# Patient Record
Sex: Male | Born: 1995 | Race: White | Hispanic: No | Marital: Single | Smoking: Never smoker
Health system: Southern US, Community
[De-identification: ages and names within clinical notes are randomized; demographics above are authoritative.]

## PROBLEM LIST (undated history)

## (undated) DIAGNOSIS — K219 Gastro-esophageal reflux disease without esophagitis: Secondary | ICD-10-CM

## (undated) DIAGNOSIS — S62309A Unspecified fracture of unspecified metacarpal bone, initial encounter for closed fracture: Secondary | ICD-10-CM

## (undated) HISTORY — PX: TYMPANOSTOMY TUBE PLACEMENT: SHX32

---
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
## 2018-03-29 DIAGNOSIS — S62309A Unspecified fracture of unspecified metacarpal bone, initial encounter for closed fracture: Secondary | ICD-10-CM

## 2018-03-29 HISTORY — DX: Unspecified fracture of unspecified metacarpal bone, initial encounter for closed fracture: S62.309A

## 2018-03-30 ENCOUNTER — Emergency Department (HOSPITAL_BASED_OUTPATIENT_CLINIC_OR_DEPARTMENT_OTHER)
Admission: EM | Admit: 2018-03-30 | Discharge: 2018-03-30 | Disposition: A | Discharge status: Home / Self Care | Payer: 59 | Attending: Emergency Medicine | Admitting: Emergency Medicine

## 2018-03-30 ENCOUNTER — Encounter (HOSPITAL_BASED_OUTPATIENT_CLINIC_OR_DEPARTMENT_OTHER): Payer: Self-pay | Admitting: Emergency Medicine

## 2018-03-30 ENCOUNTER — Other Ambulatory Visit: Payer: Self-pay

## 2018-03-30 ENCOUNTER — Emergency Department (HOSPITAL_BASED_OUTPATIENT_CLINIC_OR_DEPARTMENT_OTHER): Payer: 59

## 2018-03-30 DIAGNOSIS — Y929 Unspecified place or not applicable: Secondary | ICD-10-CM | POA: Insufficient documentation

## 2018-03-30 DIAGNOSIS — S62316A Displaced fracture of base of fifth metacarpal bone, right hand, initial encounter for closed fracture: Secondary | ICD-10-CM | POA: Insufficient documentation

## 2018-03-30 DIAGNOSIS — Y939 Activity, unspecified: Secondary | ICD-10-CM | POA: Insufficient documentation

## 2018-03-30 DIAGNOSIS — Y999 Unspecified external cause status: Secondary | ICD-10-CM | POA: Insufficient documentation

## 2018-03-30 DIAGNOSIS — W231XXA Caught, crushed, jammed, or pinched between stationary objects, initial encounter: Secondary | ICD-10-CM | POA: Insufficient documentation

## 2018-03-30 DIAGNOSIS — Z79899 Other long term (current) drug therapy: Secondary | ICD-10-CM | POA: Insufficient documentation

## 2018-03-30 MED ORDER — NAPROXEN 500 MG PO TABS: 500.0000 mg | ORAL_TABLET | Freq: Two times a day (BID) | ORAL | 0 refills | Status: DC

## 2018-03-30 NOTE — ED Provider Notes (Signed)
[facility] EMERGENCY DEPARTMENT Provider Note   CSN: [id number] Arrival date & time: 03/30/18  1354     History   Chief Complaint Chief Complaint  Patient presents with  . Wrist Injury    HPI Kerry Padilla is a 22 y.o. male.  Patient presents the emergency department with acute onset of right hand pain yesterday when a sliding glass door was accidentally closed on the hand.  He has had pain more so over the ulnar aspect of the hand.  No treatments prior to arrival.  Pain is worse with palpation and movement.  He denies numbness or tingling. The course is constant.     Past Medical History:  Diagnosis Date  . Ankle sprain   . Concussion 2011  . Nasal bone fx-closed   . Scoliosis   . Shoulder sprain     There are no active problems to display for this patient.   Past Surgical History:  Procedure Laterality Date  . CIRCUMCISION  1999  . TYMPANOSTOMY TUBE PLACEMENT     15 months old        Home Medications    Prior to Admission medications   Medication Sig Start Date End Date Taking? Authorizing Provider  acetaminophen (TYLENOL) 325 MG tablet Take 3 tablets (975 mg total) by mouth every 6 (six) hours as needed for fever. Patient not taking: Reported on 03/07/2015 05/07/14   Marcellina MillinGaley, Timothy, MD  acetaminophen (TYLENOL) 500 MG tablet Take 1,000 mg by mouth.    [provider]  ibuprofen (ADVIL,MOTRIN) 600 MG tablet Take 1 tablet (600 mg total) by mouth every 6 (six) hours as needed for fever. Patient not taking: Reported on 03/07/2015 05/07/14   Marcellina MillinGaley, Timothy, MD    Family History Family History  Problem Relation Age of Onset  . Heart Problems Maternal Grandfather        MI died at 1455  . Other Maternal Grandmother        Died at 5167 due to respiratory failure    Social History Social History   Tobacco Use  . Smoking status: Never Smoker  . Smokeless tobacco: Never Used  Substance Use Topics  . Alcohol use: No    Alcohol/week: 0.0 oz    . Drug use: No     Allergies   Strawberry extract   Review of Systems Review of Systems  Constitutional: Negative for activity change.  Musculoskeletal: Positive for arthralgias and myalgias. Negative for back pain, gait problem, joint swelling and neck pain.  Skin: Negative for wound.  Neurological: Negative for weakness and numbness.     Physical Exam Updated Vital Signs BP (!) 145/87 (BP Location: Left Arm)   Pulse 88   Temp 98.5 F (36.9 C) (Oral)   Resp 16   Ht 5\' 11"  (1.803 m)   Wt 77.1 kg (170 lb)   SpO2 100%   BMI 23.71 kg/m   Physical Exam  Constitutional: He appears well-developed and well-nourished.  HENT:  Head: Normocephalic and atraumatic.  Mouth/Throat: Oropharynx is clear and moist.  Eyes: Conjunctivae are normal.  Neck: Normal range of motion. Neck supple.  Cardiovascular: Normal pulses. Exam reveals no decreased pulses.  Musculoskeletal: He exhibits tenderness. He exhibits no edema.       Right elbow: Normal.      Right wrist: He exhibits tenderness. He exhibits normal range of motion and no bony tenderness.       Right forearm: Normal.       Right  hand: He exhibits decreased range of motion, tenderness and bony tenderness.       Hands: Neurological: He is alert. No sensory deficit.  Motor, sensation, and vascular distal to the injury is fully intact.   Skin: Skin is warm and dry.  Psychiatric: He has a normal mood and affect.  Nursing note and vitals reviewed.    ED Treatments / Results  Labs (all labs ordered are listed, but only abnormal results are displayed) Labs Reviewed - No data to display  EKG None  Radiology Dg Wrist Complete Right  Result Date: 03/30/2018 CLINICAL DATA:  Trauma to the right hand last night. EXAM: RIGHT WRIST - COMPLETE 3+ VIEW COMPARISON:  None. FINDINGS: There is comminuted displaced fracture at the base of the fifth metacarpal. There is no dislocation. IMPRESSION: Fracture at the base of fifth metacarpal.  Electronically Signed   By: Sherian Rein M.D.   On: 03/30/2018 14:37   Dg Hand Complete Right  Result Date: 03/30/2018 CLINICAL DATA:  Trauma to the right hand last night. EXAM: RIGHT HAND - COMPLETE 3+ VIEW COMPARISON:  None. FINDINGS: There is intra articular displaced comminuted fracture at the base of the fifth metacarpal. There is no dislocation. IMPRESSION: Fracture at the base of the fifth metacarpal. Electronically Signed   By: Sherian Rein M.D.   On: 03/30/2018 14:36    Procedures Procedures (including critical care time)  Medications Ordered in ED Medications - No data to display   Initial Impression / Assessment and Plan / ED Course  I have reviewed the triage vital signs and the nursing notes.  Pertinent labs & imaging results that were available during my care of the patient were reviewed by me and considered in my medical decision making (see chart for details).     Patient seen and examined.   Vital signs reviewed and are as follows: BP (!) 145/87 (BP Location: Left Arm)   Pulse 88   Temp 98.5 F (36.9 C) (Oral)   Resp 16   Ht 5\' 11"  (1.803 m)   Wt 77.1 kg (170 lb)   SpO2 100%   BMI 23.71 kg/m   Discussed images with patient at bedside.  Will place an ulnar gutter splint and have patient follow-up with orthopedic hand surgery.  Naproxen for pain.  Discussed rice protocol.  Final Clinical Impressions(s) / ED Diagnoses   Final diagnoses:  Closed displaced fracture of base of fifth metacarpal bone of right hand, initial encounter   Patient with base of fifth metatarsal fracture, closed after injury yesterday.  Patient placed in a splint.  Hand is neurovascularly intact.  Orthopedic hand referral given.  ED Discharge Orders        Ordered    naproxen (NAPROSYN) 500 MG tablet  2 times daily     03/30/18 1519       Renne Crigler, PA-C 03/30/18 1523    Linwood Dibbles, MD 03/31/18 [phone]

## 2018-03-30 NOTE — ED Triage Notes (Signed)
Patient states that he had his right hand caught in a door last night. Patient to his wrist and hand on the right side

## 2018-03-30 NOTE — Discharge Instructions (Signed)
Please read and follow all provided instructions.  Your diagnoses today include:  1. Closed displaced fracture of base of fifth metacarpal bone of right hand, initial encounter     Tests performed today include:  An x-ray of the affected area - shows fractured base of the 5th metacarpal  Vital signs. See below for your results today.   Medications prescribed:   Naproxen - anti-inflammatory pain medication  Do not exceed 500mg  naproxen every 12 hours, take with food  You have been prescribed an anti-inflammatory medication or NSAID. Take with food. Take smallest effective dose for the shortest duration needed for your pain. Stop taking if you experience stomach pain or vomiting.   Take any prescribed medications only as directed.  Home care instructions:   Follow any educational materials contained in this packet  Follow R.I.C.E. Protocol:  R - rest your injury   I  - use ice on injury without applying directly to skin  C - compress injury with bandage or splint  E - elevate the injury as much as possible  Follow-up instructions: Please follow-up with the provided orthopedic physician in 1 week.   Return instructions:   Please return if your fingers are numb or tingling, appear gray or blue, or you have severe pain (also elevate the arm and loosen splint or wrap if you were given one)  Please return to the Emergency Department if you experience worsening symptoms.   Please return if you have any other emergent concerns.  Additional Information:  Your vital signs today were: BP (!) 145/87 (BP Location: Left Arm)    Pulse 88    Temp 98.5 F (36.9 C) (Oral)    Resp 16    Ht 5\' 11"  (1.803 m)    Wt 77.1 kg (170 lb)    SpO2 100%    BMI 23.71 kg/m  If your blood pressure (BP) was elevated above 135/85 this visit, please have this repeated by your doctor within one month. --------------

## 2018-03-31 ENCOUNTER — Other Ambulatory Visit: Payer: Self-pay | Admitting: Sports Medicine

## 2018-03-31 DIAGNOSIS — M79644 Pain in right finger(s): Secondary | ICD-10-CM

## 2018-04-03 ENCOUNTER — Ambulatory Visit
Admission: RE | Admit: 2018-04-03 | Discharge: 2018-04-03 | Disposition: A | Discharge status: Home / Self Care | Payer: 59 | Source: Ambulatory Visit | Attending: Sports Medicine | Admitting: Sports Medicine

## 2018-04-03 DIAGNOSIS — M79644 Pain in right finger(s): Secondary | ICD-10-CM

## 2018-04-04 ENCOUNTER — Other Ambulatory Visit: Payer: Self-pay

## 2018-04-04 ENCOUNTER — Encounter (HOSPITAL_BASED_OUTPATIENT_CLINIC_OR_DEPARTMENT_OTHER): Payer: Self-pay | Admitting: *Deleted

## 2018-04-07 NOTE — H&P (Signed)
[facility] [address]   [address] [place], [place] [id number] [phone]  A Division of [facility]  RE: Kerry Padilla, Kerry Padilla                                  [id number]         DOB: 07/11/1996 04/04/2018  Reason for visit:  Referral from Dr. Farris HasKramer for right 5th metacarpal base intraarticular fracture subluxation.    HPI: He did this on Saturday.  His hand was slammed in a barn door.  Pain has been well controlled.    OBJECTIVE: The patient is a well appearing male, in no apparent distress.  Skin is benign.  Neurovascularly intact.    IMAGES: X-rays including a CAT scan show an intraarticular split and displacement with subluxation of the base of the 5th metacarpal.    ASSESSMENT/PLAN:  Given his articular step off and displacement, I would recommend an open versus closed reduction and pinning versus fixation.  We will set this up for next week.      Jewel Baizeimothy D.  Eulah PontMurphy, M.D.  Electronically verified by Jewel Baizeimothy D. Eulah PontMurphy, M.D. TDM:pmw D 04/04/18 T 04/07/18

## 2018-04-08 ENCOUNTER — Ambulatory Visit (HOSPITAL_BASED_OUTPATIENT_CLINIC_OR_DEPARTMENT_OTHER): Payer: Self-pay | Admitting: Anesthesiology

## 2018-04-08 ENCOUNTER — Other Ambulatory Visit: Payer: Self-pay

## 2018-04-08 ENCOUNTER — Ambulatory Visit (HOSPITAL_BASED_OUTPATIENT_CLINIC_OR_DEPARTMENT_OTHER)
Admission: RE | Admit: 2018-04-08 | Discharge: 2018-04-08 | Disposition: A | Discharge status: Home / Self Care | Payer: Self-pay | Source: Ambulatory Visit | Attending: Orthopedic Surgery | Admitting: Orthopedic Surgery

## 2018-04-08 ENCOUNTER — Encounter (HOSPITAL_BASED_OUTPATIENT_CLINIC_OR_DEPARTMENT_OTHER): Payer: Self-pay

## 2018-04-08 ENCOUNTER — Encounter (HOSPITAL_BASED_OUTPATIENT_CLINIC_OR_DEPARTMENT_OTHER)
Admission: RE | Disposition: A | Discharge status: Home / Self Care | Payer: Self-pay | Source: Ambulatory Visit | Attending: Orthopedic Surgery

## 2018-04-08 DIAGNOSIS — W230XXA Caught, crushed, jammed, or pinched between moving objects, initial encounter: Secondary | ICD-10-CM | POA: Insufficient documentation

## 2018-04-08 DIAGNOSIS — K219 Gastro-esophageal reflux disease without esophagitis: Secondary | ICD-10-CM | POA: Insufficient documentation

## 2018-04-08 DIAGNOSIS — S62316A Displaced fracture of base of fifth metacarpal bone, right hand, initial encounter for closed fracture: Secondary | ICD-10-CM | POA: Insufficient documentation

## 2018-04-08 HISTORY — DX: Gastro-esophageal reflux disease without esophagitis: K21.9

## 2018-04-08 HISTORY — PX: CLOSED REDUCTION METACARPAL WITH PERCUTANEOUS PINNING: SHX5613

## 2018-04-08 HISTORY — DX: Unspecified fracture of unspecified metacarpal bone, initial encounter for closed fracture: S62.309A

## 2018-04-08 SURGERY — CLOSED REDUCTION, FRACTURE, METACARPAL BONE, WITH PERCUTANEOUS PINNING
Anesthesia: General | Site: Hand | Laterality: Right

## 2018-04-08 MED ORDER — ACETAMINOPHEN 500 MG PO TABS
ORAL_TABLET | ORAL | Status: AC
Start: 2018-04-08 — End: ?
  Filled 2018-04-08: qty 2

## 2018-04-08 MED ORDER — OXYCODONE HCL 5 MG PO TABS
5.0000 mg | ORAL_TABLET | Freq: Once | ORAL | Status: AC
  Administered 2018-04-08: 5 mg via ORAL

## 2018-04-08 MED ORDER — BUPIVACAINE HCL (PF) 0.5 % IJ SOLN
INTRAMUSCULAR | Status: DC | PRN
  Administered 2018-04-08: 6 mL

## 2018-04-08 MED ORDER — ONDANSETRON HCL 4 MG/2ML IJ SOLN
INTRAMUSCULAR | Status: AC
  Filled 2018-04-08: qty 2

## 2018-04-08 MED ORDER — ONDANSETRON HCL 4 MG/2ML IJ SOLN
INTRAMUSCULAR | Status: DC | PRN
  Administered 2018-04-08: 4 mg via INTRAVENOUS

## 2018-04-08 MED ORDER — FENTANYL CITRATE (PF) 100 MCG/2ML IJ SOLN
INTRAMUSCULAR | Status: AC
  Filled 2018-04-08: qty 2

## 2018-04-08 MED ORDER — DEXAMETHASONE SODIUM PHOSPHATE 4 MG/ML IJ SOLN
INTRAMUSCULAR | Status: DC | PRN
  Administered 2018-04-08: 10 mg via INTRAVENOUS

## 2018-04-08 MED ORDER — ACETAMINOPHEN 500 MG PO TABS
1000.0000 mg | ORAL_TABLET | Freq: Once | ORAL | Status: AC
  Administered 2018-04-08: 1000 mg via ORAL

## 2018-04-08 MED ORDER — LACTATED RINGERS IV SOLN
INTRAVENOUS | Status: DC
  Administered 2018-04-08: 09:00:00 via INTRAVENOUS

## 2018-04-08 MED ORDER — LIDOCAINE HCL (CARDIAC) PF 100 MG/5ML IV SOSY
PREFILLED_SYRINGE | INTRAVENOUS | Status: AC
  Filled 2018-04-08: qty 5

## 2018-04-08 MED ORDER — FENTANYL CITRATE (PF) 100 MCG/2ML IJ SOLN
INTRAMUSCULAR | Status: AC
Start: 2018-04-08 — End: ?
  Filled 2018-04-08: qty 2

## 2018-04-08 MED ORDER — CHLORHEXIDINE GLUCONATE 4 % EX LIQD: 60.0000 mL | Freq: Once | CUTANEOUS | Status: DC

## 2018-04-08 MED ORDER — ONDANSETRON HCL 4 MG PO TABS: 4.0000 mg | ORAL_TABLET | Freq: Three times a day (TID) | ORAL | 0 refills | Status: DC | PRN

## 2018-04-08 MED ORDER — GABAPENTIN 300 MG PO CAPS
300.0000 mg | ORAL_CAPSULE | Freq: Once | ORAL | Status: AC
  Administered 2018-04-08: 300 mg via ORAL

## 2018-04-08 MED ORDER — LIDOCAINE HCL (CARDIAC) PF 100 MG/5ML IV SOSY
PREFILLED_SYRINGE | INTRAVENOUS | Status: DC | PRN
  Administered 2018-04-08: 60 mg via INTRAVENOUS

## 2018-04-08 MED ORDER — MIDAZOLAM HCL 2 MG/2ML IJ SOLN
INTRAMUSCULAR | Status: AC
  Filled 2018-04-08: qty 2

## 2018-04-08 MED ORDER — GABAPENTIN 300 MG PO CAPS
ORAL_CAPSULE | ORAL | Status: AC
  Filled 2018-04-08: qty 1

## 2018-04-08 MED ORDER — PROPOFOL 10 MG/ML IV BOLUS
INTRAVENOUS | Status: DC | PRN
  Administered 2018-04-08: 200 mg via INTRAVENOUS
  Administered 2018-04-08: 100 mg via INTRAVENOUS

## 2018-04-08 MED ORDER — FENTANYL CITRATE (PF) 100 MCG/2ML IJ SOLN
50.0000 ug | INTRAMUSCULAR | Status: DC | PRN
  Administered 2018-04-08: 100 ug via INTRAVENOUS

## 2018-04-08 MED ORDER — PROPOFOL 500 MG/50ML IV EMUL
INTRAVENOUS | Status: AC
  Filled 2018-04-08: qty 50

## 2018-04-08 MED ORDER — MIDAZOLAM HCL 2 MG/2ML IJ SOLN
1.0000 mg | INTRAMUSCULAR | Status: DC | PRN
  Administered 2018-04-08: 2 mg via INTRAVENOUS

## 2018-04-08 MED ORDER — OXYCODONE HCL 5 MG PO TABS
ORAL_TABLET | ORAL | Status: AC
  Filled 2018-04-08: qty 1

## 2018-04-08 MED ORDER — PROMETHAZINE HCL 25 MG/ML IJ SOLN: 6.2500 mg | INTRAMUSCULAR | Status: DC | PRN

## 2018-04-08 MED ORDER — HYDROCODONE-ACETAMINOPHEN 5-325 MG PO TABS: 1.0000 | ORAL_TABLET | Freq: Four times a day (QID) | ORAL | 0 refills | Status: DC | PRN

## 2018-04-08 MED ORDER — FENTANYL CITRATE (PF) 100 MCG/2ML IJ SOLN
25.0000 ug | INTRAMUSCULAR | Status: DC | PRN
  Administered 2018-04-08 (×2): 25 ug via INTRAVENOUS

## 2018-04-08 MED ORDER — SCOPOLAMINE 1 MG/3DAYS TD PT72: 1.0000 | MEDICATED_PATCH | Freq: Once | TRANSDERMAL | Status: DC | PRN

## 2018-04-08 MED ORDER — DEXAMETHASONE SODIUM PHOSPHATE 10 MG/ML IJ SOLN
INTRAMUSCULAR | Status: AC
  Filled 2018-04-08: qty 1

## 2018-04-08 MED ORDER — CEFAZOLIN SODIUM-DEXTROSE 2-4 GM/100ML-% IV SOLN
2.0000 g | INTRAVENOUS | Status: AC
  Administered 2018-04-08: 2 g via INTRAVENOUS

## 2018-04-08 MED ORDER — LACTATED RINGERS IV SOLN
INTRAVENOUS | Status: DC
  Administered 2018-04-08: 08:00:00 via INTRAVENOUS

## 2018-04-08 MED ORDER — CEFAZOLIN SODIUM-DEXTROSE 2-4 GM/100ML-% IV SOLN
INTRAVENOUS | Status: AC
  Filled 2018-04-08: qty 100

## 2018-04-08 SURGICAL SUPPLY — 62 items
BANDAGE ACE 4X5 VEL STRL LF (GAUZE/BANDAGES/DRESSINGS) ×4 IMPLANT
BLADE SURG 15 STRL LF DISP TIS (BLADE) ×2 IMPLANT
BLADE SURG 15 STRL SS (BLADE) ×4
BNDG CMPR 9X4 STRL LF SNTH (GAUZE/BANDAGES/DRESSINGS)
BNDG ESMARK 4X9 LF (GAUZE/BANDAGES/DRESSINGS) ×1 IMPLANT
CHLORAPREP W/TINT 26ML (MISCELLANEOUS) ×4 IMPLANT
CLOSURE STERI-STRIP 1/2X4 (GAUZE/BANDAGES/DRESSINGS) ×1
CLSR STERI-STRIP ANTIMIC 1/2X4 (GAUZE/BANDAGES/DRESSINGS) ×3 IMPLANT
CORD BIPOLAR FORCEPS 12FT (ELECTRODE) ×1 IMPLANT
COVER BACK TABLE 60X90IN (DRAPES) ×4 IMPLANT
CUFF TOURNIQUET SINGLE 18IN (TOURNIQUET CUFF) ×4 IMPLANT
CUFF TOURNIQUET SINGLE 24IN (TOURNIQUET CUFF) IMPLANT
DECANTER SPIKE VIAL GLASS SM (MISCELLANEOUS) IMPLANT
DRAPE EXTREMITY T 121X128X90 (DRAPE) ×4 IMPLANT
DRAPE IMP U-DRAPE 54X76 (DRAPES) ×4 IMPLANT
DRAPE OEC MINIVIEW 54X84 (DRAPES) ×4 IMPLANT
DRAPE SURG 17X23 STRL (DRAPES) IMPLANT
DRSG EMULSION OIL 3X3 NADH (GAUZE/BANDAGES/DRESSINGS) ×4 IMPLANT
GAUZE SPONGE 4X4 12PLY STRL (GAUZE/BANDAGES/DRESSINGS) ×4 IMPLANT
GAUZE XEROFORM 1X8 LF (GAUZE/BANDAGES/DRESSINGS) IMPLANT
GLOVE BIO SURGEON STRL SZ 6.5 (GLOVE) ×2 IMPLANT
GLOVE BIO SURGEON STRL SZ7.5 (GLOVE) ×8 IMPLANT
GLOVE BIO SURGEONS STRL SZ 6.5 (GLOVE) ×1
GLOVE BIOGEL PI IND STRL 7.0 (GLOVE) ×4 IMPLANT
GLOVE BIOGEL PI IND STRL 8 (GLOVE) ×4 IMPLANT
GLOVE BIOGEL PI INDICATOR 7.0 (GLOVE) ×4
GLOVE BIOGEL PI INDICATOR 8 (GLOVE) ×4
GOWN STRL REUS W/ TWL LRG LVL3 (GOWN DISPOSABLE) ×4 IMPLANT
GOWN STRL REUS W/ TWL XL LVL3 (GOWN DISPOSABLE) ×2 IMPLANT
GOWN STRL REUS W/TWL LRG LVL3 (GOWN DISPOSABLE) ×8
GOWN STRL REUS W/TWL XL LVL3 (GOWN DISPOSABLE) ×4
NDL HYPO 25X1 1.5 SAFETY (NEEDLE) ×1 IMPLANT
NEEDLE HYPO 25X1 1.5 SAFETY (NEEDLE) ×4 IMPLANT
NS IRRIG 1000ML POUR BTL (IV SOLUTION) ×4 IMPLANT
PACK BASIN DAY SURGERY FS (CUSTOM PROCEDURE TRAY) ×4 IMPLANT
PAD CAST 4YDX4 CTTN HI CHSV (CAST SUPPLIES) ×2 IMPLANT
PADDING CAST ABS 4INX4YD NS (CAST SUPPLIES) ×2
PADDING CAST ABS COTTON 4X4 ST (CAST SUPPLIES) ×2 IMPLANT
PADDING CAST COTTON 4X4 STRL (CAST SUPPLIES) ×4
SLEEVE SCD COMPRESS KNEE MED (MISCELLANEOUS) IMPLANT
SPLINT FAST PLASTER 5X30 (CAST SUPPLIES) ×10
SPLINT PLASTER CAST FAST 5X30 (CAST SUPPLIES) ×5 IMPLANT
SPLINT PLASTER CAST XFAST 3X15 (CAST SUPPLIES) IMPLANT
SPLINT PLASTER CAST XFAST 4X15 (CAST SUPPLIES) IMPLANT
SPLINT PLASTER XTRA FAST SET 4 (CAST SUPPLIES)
SPLINT PLASTER XTRA FASTSET 3X (CAST SUPPLIES)
SUCTION FRAZIER HANDLE 10FR (MISCELLANEOUS)
SUCTION TUBE FRAZIER 10FR DISP (MISCELLANEOUS) IMPLANT
SUT ETHILON 3 0 PS 1 (SUTURE) IMPLANT
SUT MON AB 2-0 CT1 36 (SUTURE) IMPLANT
SUT MON AB 4-0 PC3 18 (SUTURE) IMPLANT
SUT PROLENE 3 0 PS 2 (SUTURE) IMPLANT
SUT VIC AB 2-0 SH 27 (SUTURE)
SUT VIC AB 2-0 SH 27XBRD (SUTURE) IMPLANT
SUT VIC AB 3-0 FS2 27 (SUTURE) IMPLANT
SYR BULB 3OZ (MISCELLANEOUS) ×4 IMPLANT
SYR CONTROL 10ML LL (SYRINGE) ×4 IMPLANT
TOWEL GREEN STERILE FF (TOWEL DISPOSABLE) ×4 IMPLANT
TOWEL OR NON WOVEN STRL DISP B (DISPOSABLE) ×4 IMPLANT
TUBE CONNECTING 20'X1/4 (TUBING)
TUBE CONNECTING 20X1/4 (TUBING) IMPLANT
UNDERPAD 30X30 (UNDERPADS AND DIAPERS) ×4 IMPLANT

## 2018-04-08 NOTE — Interval H&P Note (Signed)
History and Physical Interval Note:  04/08/2018 8:50 AM  Kerry Padilla  has presented today for surgery, with the diagnosis of RIGHT LITTLE FINGER FRACTURE OF METACARPAL BONE  The various methods of treatment have been discussed with the patient and family. After consideration of risks, benefits and other options for treatment, the patient has consented to  Procedure(s): OPEN REDUCTION INTERNAL FIXATION (ORIF) VERSUS CLOSED REDUCTION PERCUTANEOUS PINNING RIGHT 5TH METACARPAL (Right) as a surgical intervention .  The patient's history has been reviewed, patient examined, no change in status, stable for surgery.  I have reviewed the patient's chart and labs.  Questions were answered to the patient's satisfaction.     Darianny Momon D

## 2018-04-08 NOTE — Anesthesia Preprocedure Evaluation (Signed)
Anesthesia Evaluation  Patient identified by MRN, date of birth, ID band Patient awake    Reviewed: Allergy & Precautions, NPO status , Patient's Chart, lab work & pertinent test results  Airway Mallampati: II  TM Distance: >3 FB Neck ROM: Full    Dental  (+) Dental Advisory Given   Pulmonary neg pulmonary ROS,    breath sounds clear to auscultation       Cardiovascular negative cardio ROS   Rhythm:Regular Rate:Normal     Neuro/Psych negative neurological ROS     GI/Hepatic Neg liver ROS, GERD  ,  Endo/Other  negative endocrine ROS  Renal/GU negative Renal ROS     Musculoskeletal   Abdominal   Peds  Hematology negative hematology ROS (+)   Anesthesia Other Findings   Reproductive/Obstetrics                             Anesthesia Physical Anesthesia Plan  ASA: I  Anesthesia Plan: General   Post-op Pain Management:    Induction: Intravenous  PONV Risk Score and Plan: 2 and Dexamethasone, Ondansetron and Treatment may vary due to age or medical condition  Airway Management Planned: LMA  Additional Equipment:   Intra-op Plan:   Post-operative Plan: Extubation in OR  Informed Consent: I have reviewed the patients History and Physical, chart, labs and discussed the procedure including the risks, benefits and alternatives for the proposed anesthesia with the patient or authorized representative who has indicated his/her understanding and acceptance.   Dental advisory given  Plan Discussed with: CRNA  Anesthesia Plan Comments:         Anesthesia Quick Evaluation

## 2018-04-08 NOTE — Anesthesia Postprocedure Evaluation (Signed)
Anesthesia Post Note  Patient: Kerry GandyDylan J Padilla  Procedure(s) Performed: CLOSED REDUCTION METACARPAL WITH PERCUTANEOUS PINNING SMALL FINGER (Right Hand)     Patient location during evaluation: PACU Anesthesia Type: General Level of consciousness: awake and alert Pain management: pain level controlled Vital Signs Assessment: post-procedure vital signs reviewed and stable Respiratory status: spontaneous breathing, nonlabored ventilation, respiratory function stable and patient connected to nasal cannula oxygen Cardiovascular status: blood pressure returned to baseline and stable Postop Assessment: no apparent nausea or vomiting Anesthetic complications: no    Last Vitals:  Vitals:   04/08/18 1015 04/08/18 1030  BP: 128/86 127/86  Pulse: 76 77  Resp: 13 13  Temp:    SpO2: 95% 96%    Last Pain:  Vitals:   04/08/18 1015  TempSrc:   PainSc: 6                  Kennieth RadFitzgerald, Getsemani Lindon E

## 2018-04-08 NOTE — Transfer of Care (Signed)
Immediate Anesthesia Transfer of Care Note  Patient: Kerry Padilla  Procedure(s) Performed: OPEN REDUCTION INTERNAL FIXATION (ORIF) VERSUS CLOSED REDUCTION PERCUTANEOUS PINNING RIGHT 5TH METACARPAL (Right )  Patient Location: PACU  Anesthesia Type:General  Level of Consciousness: sedated and responds to stimulation  Airway & Oxygen Therapy: Patient Spontanous Breathing and Patient connected to face mask oxygen  Post-op Assessment: Report given to RN and Post -op Vital signs reviewed and stable  Post vital signs: Reviewed and stable  Last Vitals:  Vitals Value Taken Time  BP 106/64 04/08/2018  9:46 AM  Temp    Pulse 65 04/08/2018  9:47 AM  Resp 12 04/08/2018  9:47 AM  SpO2 98 % 04/08/2018  9:47 AM  Vitals shown include unvalidated device data.  Last Pain:  Vitals:   04/08/18 0747  TempSrc: Oral  PainSc:          Complications: No apparent anesthesia complications

## 2018-04-08 NOTE — Anesthesia Procedure Notes (Signed)
Procedure Name: LMA Insertion Date/Time: 04/08/2018 9:18 AM Performed by: Gar GibbonKeeton, Coron Rossano S, CRNA Pre-anesthesia Checklist: Patient identified, Emergency Drugs available, Suction available and Patient being monitored Patient Re-evaluated:Patient Re-evaluated prior to induction Oxygen Delivery Method: Circle system utilized Preoxygenation: Pre-oxygenation with 100% oxygen Induction Type: IV induction Ventilation: Mask ventilation without difficulty LMA: LMA inserted LMA Size: 4.0 Number of attempts: 1 Airway Equipment and Method: Bite block Placement Confirmation: positive ETCO2 Tube secured with: Tape Dental Injury: Teeth and Oropharynx as per pre-operative assessment

## 2018-04-08 NOTE — Op Note (Signed)
04/08/2018  12:14 PM  PATIENT:  Kerry Padilla    PRE-OPERATIVE DIAGNOSIS:  RIGHT LITTLE FINGER FRACTURE OF METACARPAL BONE  POST-OPERATIVE DIAGNOSIS:  Same  PROCEDURE:  CLOSED REDUCTION METACARPAL WITH PERCUTANEOUS PINNING SMALL FINGER  SURGEON:  Havah Ammon, Jewel BaizeIMOTHY D, MD  ASSISTANT: Aquilla HackerHenry Martensen, PA-C, he was present and scrubbed throughout the case, critical for completion in a timely fashion, and for retraction, instrumentation, and closure.   ANESTHESIA:   gen  PREOPERATIVE INDICATIONS:  Kerry Padilla is a  22 y.o. male with a diagnosis of RIGHT LITTLE FINGER FRACTURE OF METACARPAL BONE who failed conservative measures and elected for surgical management.    The risks benefits and alternatives were discussed with the patient preoperatively including but not limited to the risks of infection, bleeding, nerve injury, cardiopulmonary complications, the need for revision surgery, among others, and the patient was willing to proceed.  OPERATIVE IMPLANTS: K wires  OPERATIVE FINDINGS: stable CMC joint post pinning  BLOOD LOSS: min  COMPLICATIONS: none  TOURNIQUET TIME: none  OPERATIVE PROCEDURE:  Patient was identified in the preoperative holding area and site was marked by me He was transported to the operating theater and placed on the table in supine position taking care to pad all bony prominences. After a preincinduction time out anesthesia was induced. The right upper extremity was prepped and draped in normal sterile fashion and a pre-incision timeout was performed. He received ancef for preoperative antibiotics.   I performed a closed reduction maneuver to his CMC fracture dislocation it was able to be reduced into an anatomic position I then placed a 6 2 K wire across the [facility] joint securing this in the place I then placed a second 6 2 K wire across the base of the metacarpals.  I took multiple x-rays as happy with the placement of the K wires as happy with the stability of  his fracture and his CMC joint.  I then bent and cut the K wires placed sterile dressings and placed him in an ulnar gutter splint he was awoken and taken to the PACU in stable condition  POST OPERATIVE PLAN: mobilize for dvt px

## 2018-04-08 NOTE — Discharge Instructions (Signed)
Keep splint Clean and dry   Tylenol 1000mg  given today at 0800AM.   Post Anesthesia Home Care Instructions  Activity: Get plenty of rest for the remainder of the day. A responsible individual must stay with you for 24 hours following the procedure.  For the next 24 hours, DO NOT: -Drive a car -Advertising copywriterperate machinery -Drink alcoholic beverages -Take any medication unless instructed by your physician -Make any legal decisions or sign important papers.  Meals: Start with liquid foods such as gelatin or soup. Progress to regular foods as tolerated. Avoid greasy, spicy, heavy foods. If nausea and/or vomiting occur, drink only clear liquids until the nausea and/or vomiting subsides. Call your physician if vomiting continues.  Special Instructions/Symptoms: Your throat may feel dry or sore from the anesthesia or the breathing tube placed in your throat during surgery. If this causes discomfort, gargle with warm salt water. The discomfort should disappear within 24 hours.  If you had a scopolamine patch placed behind your ear for the management of post- operative nausea and/or vomiting:  1. The medication in the patch is effective for 72 hours, after which it should be removed.  Wrap patch in a tissue and discard in the trash. Wash hands thoroughly with soap and water. 2. You may remove the patch earlier than 72 hours if you experience unpleasant side effects which may include dry mouth, dizziness or visual disturbances. 3. Avoid touching the patch. Wash your hands with soap and water after contact with the patch.

## 2018-04-09 ENCOUNTER — Encounter (HOSPITAL_BASED_OUTPATIENT_CLINIC_OR_DEPARTMENT_OTHER): Payer: Self-pay | Admitting: Orthopedic Surgery

## 2018-10-26 ENCOUNTER — Encounter (HOSPITAL_COMMUNITY): Payer: Self-pay | Admitting: Emergency Medicine

## 2018-10-26 ENCOUNTER — Inpatient Hospital Stay (HOSPITAL_COMMUNITY)
Admission: AD | Admit: 2018-10-26 | Discharge: 2018-10-29 | DRG: 885 | Disposition: A | Discharge status: Home / Self Care | Payer: Medicaid Other | Attending: Psychiatry | Admitting: Psychiatry

## 2018-10-26 ENCOUNTER — Other Ambulatory Visit: Payer: Self-pay

## 2018-10-26 DIAGNOSIS — Z23 Encounter for immunization: Secondary | ICD-10-CM | POA: Diagnosis not present

## 2018-10-26 DIAGNOSIS — F419 Anxiety disorder, unspecified: Secondary | ICD-10-CM | POA: Diagnosis not present

## 2018-10-26 DIAGNOSIS — G4700 Insomnia, unspecified: Secondary | ICD-10-CM | POA: Diagnosis not present

## 2018-10-26 DIAGNOSIS — K219 Gastro-esophageal reflux disease without esophagitis: Secondary | ICD-10-CM | POA: Diagnosis present

## 2018-10-26 DIAGNOSIS — R45851 Suicidal ideations: Secondary | ICD-10-CM | POA: Diagnosis present

## 2018-10-26 DIAGNOSIS — F332 Major depressive disorder, recurrent severe without psychotic features: Principal | ICD-10-CM | POA: Diagnosis present

## 2018-10-26 DIAGNOSIS — F1010 Alcohol abuse, uncomplicated: Secondary | ICD-10-CM | POA: Diagnosis not present

## 2018-10-26 MED ORDER — INFLUENZA VAC SPLIT QUAD 0.5 ML IM SUSY
0.5000 mL | PREFILLED_SYRINGE | INTRAMUSCULAR | Status: AC
  Administered 2018-10-27: 0.5 mL via INTRAMUSCULAR
  Filled 2018-10-26: qty 0.5

## 2018-10-26 MED ORDER — MAGNESIUM HYDROXIDE 400 MG/5ML PO SUSP: 30.0000 mL | Freq: Every day | ORAL | Status: DC | PRN

## 2018-10-26 MED ORDER — HYDROXYZINE HCL 25 MG PO TABS
25.0000 mg | ORAL_TABLET | Freq: Three times a day (TID) | ORAL | Status: DC | PRN
  Filled 2018-10-26: qty 10
  Filled 2018-10-26: qty 1

## 2018-10-26 MED ORDER — ACETAMINOPHEN 325 MG PO TABS: 650.0000 mg | ORAL_TABLET | Freq: Four times a day (QID) | ORAL | Status: DC | PRN

## 2018-10-26 MED ORDER — ALUM & MAG HYDROXIDE-SIMETH 200-200-20 MG/5ML PO SUSP: 30.0000 mL | ORAL | Status: DC | PRN

## 2018-10-26 MED ORDER — TRAZODONE HCL 50 MG PO TABS
50.0000 mg | ORAL_TABLET | Freq: Every evening | ORAL | Status: DC | PRN
  Filled 2018-10-26: qty 1
  Filled 2018-10-26: qty 7

## 2018-10-26 NOTE — Tx Team (Signed)
Initial Treatment Plan 10/26/2018 6:44 PM Kerry Padilla [id number]    PATIENT STRESSORS: Marital or family conflict Occupational concerns   PATIENT STRENGTHS: Ability for insight Communication skills Motivation for treatment/growth Special hobby/interest Supportive family/friends   PATIENT IDENTIFIED PROBLEMS: "Dealing with stress"  Not knowing how to be a better support for somebody else"  "My workload at jobs"  "Quitting jobs because I can't handle the stress and workload"               DISCHARGE CRITERIA:  Improved stabilization in mood, thinking, and/or behavior Reduction of life-threatening or endangering symptoms to within safe limits Verbal commitment to aftercare and medication compliance  PRELIMINARY DISCHARGE PLAN: Outpatient therapy Return to previous living arrangement Return to previous work or school arrangements  PATIENT/FAMILY INVOLVEMENT: This treatment plan has been presented to and reviewed with the patient, Kerry Padilla, and/or family member.  The patient and family have been given the opportunity to ask questions and make suggestions.  Tania Ade, RN 10/26/2018, 6:44 PM

## 2018-10-26 NOTE — Progress Notes (Signed)
Patient did not attend 4:15 pm group on psychoeducational information today.   

## 2018-10-26 NOTE — BH Assessment (Signed)
Assessment Note  Kerry Padilla is an 23 y.o. male without psych history presents with intentional overdose.  States that he drank 10+ beers, half a 5th of vodka, and took a bottle of Tylenol and the bottle of ibuprofen after girlfriend broke up with him.  Unknown number of pills.  States this happened about 10:00 pm.  States he vomited once after taking the pills.  Denies symptoms currently.  He left a suicide note. Patient denies HI and psychosis. Appetite and sleep are normal. Patient is currently employed and resides alone. Patient reported being on no medications. Patient has no history of SI attempts or self harming behaviors.   Medication Compliance: N/A Reason for seeking treatment: Family Presented With: Reports: Suicidal Ideation (Attempted overdose on tylenol and ibuprofen.) Recent stressful life event/ illness: Reports: loss of relationship Apperance: Stated Age Attitude: Cooperative Mood: Calm, Sad Affect: Congruent w/ mood Memory Description: Reports: Intact Suicidal Intent: Reports: Left Suicide Note Suicidal Plan: Reports: Overdose Risk for physical violence towards others: Reports: Not an issue Homicidal Ideation: No Does patient have access to weapons?: No Criminal charges pending: No Court Date (if yes when): No Hallucination Type: Reports: None Behavioral Stressors: Reports: Relationships History of: Reports: None  - Medical History Psychological History: Denies: Depression Social History: Denies: Tobacco Use in the Last 30 Days  - Diagnosis Primary Diagnosis:: Major depressive disorder  - Disposition and Plan Diagnosis - Patient Problems:  Current Active Problems  Intentional acetaminophen overdose (Acute) T39.1X2A   Does patient meet inpatient criteria for hospital admission?: Yes Recommend /or Refer: Inpatient Therapy Action/Disposition Plan:   Nira Conn, NP, patient meets inpatient criteria. TTS to secure placement.  Diagnosis: F32.2 Major  Depressive disorder, single episode, severe without psychotic features  Past Medical History:  Past Medical History:  Diagnosis Date  . GERD (gastroesophageal reflux disease)    TUMS  as needed  . Metacarpal bone fracture 03/29/2018   right 5th    Past Surgical History:  Procedure Laterality Date  . CLOSED REDUCTION METACARPAL WITH PERCUTANEOUS PINNING Right 04/08/2018   Procedure: CLOSED REDUCTION METACARPAL WITH PERCUTANEOUS PINNING SMALL FINGER;  Surgeon: Sheral Apley, MD;  Location: [facility];  Service: Orthopedics;  Laterality: Right;  . TYMPANOSTOMY TUBE PLACEMENT      Family History:  Family History  Problem Relation Age of Onset  . Other Maternal Grandmother        Died at 28 due to respiratory failure  . Heart Problems Maternal Grandfather        MI died at 72    Social History:  reports that he has never smoked. His smokeless tobacco use includes snuff. He reports that he does not drink alcohol or use drugs.  Additional Social History:  Alcohol / Drug Use Pain Medications: See MAR Prescriptions: See MAR Over the Counter: See MAR History of alcohol / drug use?: Yes Substance #1 Name of Substance 1: alcohol 1 - Frequency: UTA 1 - Last Use / Amount: 10/25/18  CIWA:   COWS:    Allergies:  Allergies  Allergen Reactions  . Strawberry Extract Swelling    SWELLING OF THROAT, NO SOB    Home Medications:  No medications prior to admission.    OB/GYN Status:  No LMP for male patient.  General Assessment Data Location of Assessment: [facility] TTS Assessment: Out of system Is this an Initial Assessment or a Re-assessment for this encounter?: Initial Assessment Language Other than English: No What gender do you  identify as?: Male Marital status: Single Living Arrangements: Alone Can pt return to current living arrangement?: Yes Admission Status: Involuntary Is patient capable of signing voluntary admission?: Yes Referral  Source: MD Insurance type: self pay     Crisis Care Plan Living Arrangements: Alone Name of Psychiatrist: none Name of Therapist: none  Education Status Is patient currently in school?: No Is the patient employed, unemployed or receiving disability?: Employed  Risk to self with the past 6 months Suicidal Ideation: No-Not Currently/Within Last 6 Months Has patient been a risk to self within the past 6 months prior to admission? : Yes Suicidal Intent: No-Not Currently/Within Last 6 Months Has patient had any suicidal intent within the past 6 months prior to admission? : Yes Is patient at risk for suicide?: Yes Suicidal Plan?: No-Not Currently/Within Last 6 Months Has patient had any suicidal plan within the past 6 months prior to admission? : Yes Access to Means: Yes Specify Access to Suicidal Means: alcohol and medications What has been your use of drugs/alcohol within the last 12 months?: Prior to admission Previous Attempts/Gestures: No How many times?: 0 Other Self Harm Risks: unknown Intentional Self Injurious Behavior: None Family Suicide History: Unable to assess Recent stressful life event(s): Loss (Comment)(girlfriend broke up with him) Persecutory voices/beliefs?: No Depression: No Depression Symptoms: (pt denies sx) Suicide prevention information given to non-admitted patients: Not applicable  Risk to Others within the past 6 months Homicidal Ideation: No Does patient have any lifetime risk of violence toward others beyond the six months prior to admission? : No Thoughts of Harm to Others: No Current Homicidal Intent: No Current Homicidal Plan: No Access to Homicidal Means: No History of harm to others?: No Assessment of Violence: None Noted Does patient have access to weapons?: No Criminal Charges Pending?: No Does patient have a court date: No Is patient on probation?: No  Psychosis Hallucinations: None noted Delusions: None noted  Mental Status  Report Appearance/Hygiene: Unremarkable Eye Contact: Unable to Assess Motor Activity: Unable to assess Speech: Logical/coherent Level of Consciousness: Alert Mood: Sad Affect: Sad Anxiety Level: Minimal Thought Processes: Coherent, Relevant Judgement: Impaired Orientation: Person, Place, Time, Situation, Appropriate for developmental age Obsessive Compulsive Thoughts/Behaviors: None  Cognitive Functioning Concentration: Unable to Assess Memory: Unable to Assess Is patient IDD: No Insight: Poor Impulse Control: Poor  ADLScreening [facility] Assessment Services) Patient's cognitive ability adequate to safely complete daily activities?: Yes Patient able to express need for assistance with ADLs?: Yes Independently performs ADLs?: Yes (appropriate for developmental age)  Prior Inpatient Therapy Prior Inpatient Therapy: No  Prior Outpatient Therapy Prior Outpatient Therapy: No Does patient have an ACCT team?: No Does patient have Intensive In-House Services?  : No Does patient have [facility] services? : No Does patient have P4CC services?: No  ADL Screening (condition at time of admission) Patient's cognitive ability adequate to safely complete daily activities?: Yes Is the patient deaf or have difficulty hearing?: No Does the patient have difficulty seeing, even when wearing glasses/contacts?: No Does the patient have difficulty concentrating, remembering, or making decisions?: No Patient able to express need for assistance with ADLs?: Yes Does the patient have difficulty dressing or bathing?: No Independently performs ADLs?: Yes (appropriate for developmental age) Does the patient have difficulty walking or climbing stairs?: No Weakness of Legs: None Weakness of Arms/Hands: None  Home Assistive Devices/Equipment Home Assistive Devices/Equipment: None  Therapy Consults (therapy consults require a physician order) PT Evaluation Needed: No OT Evalulation Needed: No SLP  Evaluation Needed: No  Abuse/Neglect Assessment (Assessment to be complete while patient is alone) Abuse/Neglect Assessment Can Be Completed: Unable to assess, patient is non-responsive or altered mental status     Advance Directives (For Healthcare) Does Patient Have a Medical Advance Directive?: No Would patient like information on creating a medical advance directive?: No - Patient declined          Disposition: Nira ConnJason Berry, NP, patient meets inpatient criteria. TTS to secure placement. Disposition Initial Assessment Completed for this Encounter: Yes Disposition of Patient: Admit  On Site Evaluation by:   Reviewed with Physician:    Clearnce Sorreleirdre H Ahmaad Neidhardt 10/26/2018 1:50 PM

## 2018-10-26 NOTE — Progress Notes (Signed)
D: Pt alert and oriented during [facility] admission process. Pt denies SI/HI, A/VH, and any pain. Pt is pleasant and cooperative.  "Kerry Padilla is an 23 y.o. male without psych history presents with intentional overdose.  States that he drank 10+ beers, half a 5th of vodka, and took a bottle of Tylenol and the bottle of ibuprofen after girlfriend broke up with him.  Unknown number of pills.  States this happened about 10:00 pm.  States he vomited once after taking the pills.  Denies symptoms currently.  He left a suicide note. Patient denies HI and psychosis. Appetite and sleep are normal. Patient is currently employed and resides alone. Patient reported being on no medications. Patient has no history of SI attempts or self harming behaviors."  A: Education, support, reassurance, and encouragement provided, q15 minute safety checks initiated. Pt brought no belongings and no locker was assigned.  R: Pt denies any concerns at this time, and verbally contracts for safety. Pt ambulating on the unit with no issues. Pt remains safe on and off the unit.

## 2018-10-26 NOTE — Progress Notes (Signed)
Adult Psychoeducational Group Note  Date:  10/26/2018 Time:  10:27 PM  Group Topic/Focus:  Wrap-Up Group:   The focus of this group is to help patients review their daily goal of treatment and discuss progress on daily workbooks.  Participation Level:  Active  Participation Quality:  Appropriate  Affect:  Appropriate  Cognitive:  Appropriate  Insight: Appropriate  Engagement in Group:  Engaged  Modes of Intervention:  Discussion  Additional Comments:  Patient attended wrap-up group and said that his day was a 8. His described his day as being motivated.   Dallis Darden W Daksha Koone 10/26/2018, 10:27 PM

## 2018-10-26 NOTE — [facility] Counselor (Signed)
Adult Comprehensive Assessment  Patient ID: Kerry Padilla, male   DOB: 1996-09-23, 23 y.o.   MRN: [id number]  Information Source: Information source: Patient  Current Stressors:  Patient states their primary concerns and needs for treatment are:: Stress Patient states their goals for this hospitilization and ongoing recovery are:: Ways to deal with stress better, help others deal with stress better Educational / Learning stressors: Thinking about going back to school is stressful. Employment / Job issues: In-between jobs, having difficulty figuring out what he wants to do.  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 Family Relationships: Denies stressors Financial / Lack of resources (include bankruptcy): Tight some weeks. Housing / Lack of housing: Denies stressors Physical health (include injuries & life threatening diseases): Denies stressors Social relationships: He and his fiancee decided to split up a couple of nights ago, but now they have talked things through.  That break-up did lead to his suicide attempt. Substance abuse: Wants to quit dipping. Bereavement / Loss: Denies stressors  Living/Environment/Situation:  Living Arrangements: Spouse/significant other Living conditions (as described by patient or guardian): Good Who else lives in the home?: Fiancee How long has patient lived in current situation?: 5 months What is atmosphere in current home: Comfortable, Paramedic, Supportive  Family History:  Marital status: Long term relationship Long term relationship, how long?: 7 months What types of issues is patient dealing with in the relationship?: They were talking about breaking up recently and that led to his suicide attempt, but they are now together. Are you sexually active?: Yes What is your sexual orientation?: Straight Does patient have children?: No  Childhood History:  By whom was/is the patient raised?: Mother/father and  step-parent Additional childhood history information: Stepfather came into his life when he was 23yo Description of patient's relationship with caregiver when they were a child: Father - seen sometimes, good relationship, did sports together; Mother - good, very supportive; Stepfather - good, very supportive Patient's description of current relationship with people who raised him/her: Still close to mother and stepfather, has not seen biological father in 2-3 years but they talk on the phone. How were you disciplined when you got in trouble as a child/adolescent?: Talked to, grounded from privileges, spankings Does patient have siblings?: Yes Number of Siblings: 1 Description of patient's current relationship with siblings: Younger sister - very good relationship Did patient suffer any verbal/emotional/physical/sexual abuse as a child?: No Did patient suffer from severe childhood neglect?: No Has patient ever been sexually abused/assaulted/raped as an adolescent or adult?: No Was the patient ever a victim of a crime or a disaster?: No Witnessed domestic violence?: No Has patient been effected by domestic violence as an adult?: No  Education:  Highest grade of school patient has completed: Some college Currently a Consulting civil engineer?: No Learning disability?: No  Employment/Work Situation:   Employment situation: Employed Where is patient currently employed?: [employer] service How long has patient been employed?: 1 month Patient's job has been impacted by current illness: No What is the longest time patient has a held a job?: 2 years 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?: No(Discharged out of [employer] - due to concussion) Are There Guns or Other Weapons in Your Home?: No  Financial Resources:   Financial resources: Income from employment Does patient have a representative payee or guardian?: No  Alcohol/Substance  Abuse:   What  has been your use of drugs/alcohol within the last 12 months?: Alcohol socially 1-2 times every other month; did drink prior to admission If attempted suicide, did drugs/alcohol play a role in this?: No Has alcohol/substance abuse ever caused legal problems?: No  Social Support System:   Patient's Community Support System: Good Describe Community Support System: LearyFiancee, mother Type of faith/religion: [employer] Kn[employer] How does patient's faith help to cope with current illness?: Give it up to God, always think God has a Archivistplan  Leisure/Recreation:   Leisure and Hobbies: Fish  Strengths/Needs:   What is the patient's perception of their strengths?: Good around other people, easy to get along with, hard worker Patient states they can use these personal strengths during their treatment to contribute to their recovery: Talk to others going through similar things, learn tips to help with stress.  Work hard to get better. Patient states these barriers may affect/interfere with their treatment: None Patient states these barriers may affect their return to the community: None Other important information patient would like considered in planning for their treatment: None  Discharge Plan:   Currently receiving community mental health services: No Patient states concerns and preferences for aftercare planning are: Willing to be referred for follow-up treatment Patient states they will know when they are safe and ready for discharge when: Feels ready now, saw how his suicide attempt impacted his family and friends. Does patient have access to transportation?: Yes Does patient have financial barriers related to discharge medications?: Yes Will patient be returning to same living situation after discharge?: Yes  Summary/Recommendations:   Summary and Recommendations (to be completed by the evaluator): Patient is a 23yo male admitted with an intentional overdose on alcohol and OTC pills after  writing a suicide note.  Primary stressors include work issues, financial stress, and a recent break-up with his fiance that has since been resolved.  He has no history of psychiatric treatment.  Patient will benefit from crisis stabilization, medication evaluation, group therapy and psychoeducation, in addition to case management for discharge planning. At discharge it is recommended that Patient adhere to the established discharge plan and continue in treatment.  Kerry Padilla. 10/26/2018

## 2018-10-27 DIAGNOSIS — R45851 Suicidal ideations: Secondary | ICD-10-CM

## 2018-10-27 DIAGNOSIS — F1010 Alcohol abuse, uncomplicated: Secondary | ICD-10-CM

## 2018-10-27 DIAGNOSIS — F332 Major depressive disorder, recurrent severe without psychotic features: Principal | ICD-10-CM

## 2018-10-27 LAB — LIPID PANEL
Cholesterol: 139 mg/dL (ref 0–200)
HDL: 40 mg/dL — AB (ref >40)
LDL Cholesterol: 90 mg/dL (ref 0–99)
Total CHOL/HDL Ratio: 3.5 RATIO
Triglycerides: 44 mg/dL (ref <150)
VLDL: 9 mg/dL (ref 0–40)

## 2018-10-27 LAB — HEMOGLOBIN A1C
Hgb A1c MFr Bld: 4.6 % — ABNORMAL LOW (ref 4.8–5.6)
Mean Plasma Glucose: 85.32 mg/dL

## 2018-10-27 LAB — TSH: TSH: 1.343 u[IU]/mL (ref 0.350–4.500)

## 2018-10-27 NOTE — H&P (Addendum)
Psychiatric Admission Assessment Adult  Patient Identification: Kerry Padilla MRN:  [id number] Date of Evaluation:  10/27/2018 Chief Complaint:  MDD Principal Diagnosis: Alcohol abuse Diagnosis:  Principal Problem:   Alcohol abuse Active Problems:   MDD (major depressive disorder), recurrent episode, severe ([place])   Suicidal ideations  History of Present Illness: [name] is a 23 year old male with no psychiatric or chronic medical history. He presents for treatment under IVC. Per IVC paperwork, he consumed a bottle of Tylenol and a bottle of ibuprofen along with alcohol, and left a suicide note. On assessment today, he admits to heavy drinking (10+ beers and half a fifth of vodka) on Saturday night but denies overdose on medications. [name] reports he and his live-in fiance had an argument on Saturday night 10/25/18 and decided to break up. She moved her things out of their place, and he began drinking beers by himself at home- "which obviously didn't make me feel better." He reports he became drunk and grabbed a bottle of Tylenol- "I was going to take 2 tablets because I had a headache. Then I took the bottle in the bathroom and locked the door. I had some more beers and vodka. I started throwing up and knocked over the bottle of pills. Then I passed out on the floor. The cops and my fiance found me with the empty bottle on the floor." He denies taking any pills with the ETOH.   [name] denies recent or history of depressed mood. Denies problems with sleep, energy, or appetite and states he enjoys his life. Reports his fiance was on her way back to the home to reconcile with him when she found him on the floor. He and his fiance have since reconciled. Denies SI, HI, AVH. He reports consuming ETOH "maybe once or twice a year," usually socially. He does not feel he needs to take any psychotropic medication.  Associated Signs/Symptoms: Depression Symptoms:  suicidal thoughts with specific  plan, (Hypo) Manic Symptoms:  Impulsivity, Anxiety Symptoms:  denies Psychotic Symptoms:  none PTSD Symptoms: NA Total Time spent with patient: 45 minutes  Past Psychiatric History: None. Denies history of depression, suicidal ideation. No history of hospitalizations, suicide attempts, self-injurious behaviors. No hx mania, panic attacks, violence, or psychotropic trials.  Is the patient at risk to self? Yes.    Has the patient been a risk to self in the past 6 months? No.  Has the patient been a risk to self within the distant past? No.  Is the patient a risk to others? No.  Has the patient been a risk to others in the past 6 months? No.  Has the patient been a risk to others within the distant past? No.   Prior Inpatient Therapy: Prior Inpatient Therapy: No Prior Outpatient Therapy: Prior Outpatient Therapy: No Does patient have an ACCT team?: No Does patient have Intensive In-House Services?  : No Does patient have [facility] services? : No Does patient have P4CC services?: No  Alcohol Screening: 1. How often do you have a drink containing alcohol?: Never 2. How many drinks containing alcohol do you have on a typical day when you are drinking?: 1 or 2 3. How often do you have six or more drinks on one occasion?: Never AUDIT-C Score: 0 4. How often during the last year have you found that you were not able to stop drinking once you had started?: Never 5. How often during the last year have you failed to do what was  normally expected from you becasue of drinking?: Never 6. How often during the last year have you needed a first drink in the morning to get yourself going after a heavy drinking session?: Never 7. How often during the last year have you had a feeling of guilt of remorse after drinking?: Never 8. How often during the last year have you been unable to remember what happened the night before because you had been drinking?: Never 9. Have you or someone else been injured as a  result of your drinking?: No 10. Has a relative or friend or a doctor or another health worker been concerned about your drinking or suggested you cut down?: No Alcohol Use Disorder Identification Test Final Score (AUDIT): 0 Intervention/Follow-up: Alcohol Education Substance Abuse History in the last 12 months:  Yes.  Currently hospitalized after ETOH binge. Denies any other substance abuse history. Consequences of Substance Abuse: Blackouts:  passed out on bathroom floor after drinking Previous Psychotropic Medications: No  Psychological Evaluations: No  Past Medical History:  Past Medical History:  Diagnosis Date  . GERD (gastroesophageal reflux disease)    TUMS  as needed  . Metacarpal bone fracture 03/29/2018   right 5th    Past Surgical History:  Procedure Laterality Date  . CLOSED REDUCTION METACARPAL WITH PERCUTANEOUS PINNING Right 04/08/2018   Procedure: CLOSED REDUCTION METACARPAL WITH PERCUTANEOUS PINNING SMALL FINGER;  Surgeon: Renette Butters, MD;  Location: [facility];  Service: Orthopedics;  Laterality: Right;  . TYMPANOSTOMY TUBE PLACEMENT     Family History:  Family History  Problem Relation Age of Onset  . Other Maternal Grandmother        Died at 21 due to respiratory failure  . Heart Problems Maternal Grandfather        MI died at 15   Family Psychiatric  History: denies Tobacco Screening:   Social History:  Social History   Substance and Sexual Activity  Alcohol Use Never  . Frequency: Never     Social History   Substance and Sexual Activity  Drug Use Never    Additional Social History: Marital status: Long term relationship Long term relationship, how long?: 7 months What types of issues is patient dealing with in the relationship?: They were talking about breaking up recently and that led to his suicide attempt, but they are now together. Are you sexually active?: Yes What is your sexual orientation?: Straight Does patient have  children?: No    Pain Medications: See MAR Prescriptions: See MAR Over the Counter: See MAR History of alcohol / drug use?: Yes Name of Substance 1: alcohol 1 - Frequency: UTA 1 - Last Use / Amount: 10/25/18                  Allergies:   Allergies  Allergen Reactions  . Strawberry Extract Swelling    SWELLING OF THROAT, NO SOB   Lab Results:  Results for orders placed or performed during the hospital encounter of 10/26/18 (from the past 48 hour(s))  Hemoglobin A1c     Status: Abnormal   Collection Time: 10/27/18  6:26 AM  Result Value Ref Range   Hgb A1c MFr Bld 4.6 (L) 4.8 - 5.6 %    Comment: (NOTE) Pre diabetes:          5.7%-6.4% Diabetes:              >6.4% Glycemic control for   <7.0% adults with diabetes    Mean Plasma Glucose  85.32 mg/dL    Comment: Performed at [facility], [place] [address] [place], [place] [address]  Lipid panel     Status: Abnormal   Collection Time: 10/27/18  6:26 AM  Result Value Ref Range   Cholesterol 139 0 - 200 mg/dL   Triglycerides 44 <150 mg/dL   HDL 40 (L) >40 mg/dL   Total CHOL/HDL Ratio 3.5 RATIO   VLDL 9 0 - 40 mg/dL   LDL Cholesterol 90 0 - 99 mg/dL    Comment:        Total Cholesterol/HDL:CHD Risk Coronary Heart Disease Risk Table                     Men   Women  1/2 Average Risk   3.4   3.3  Average Risk       5.0   4.4  2 X Average Risk   9.6   7.1  3 X Average Risk  23.4   11.0        Use the calculated Patient Ratio above and the CHD Risk Table to determine the patient's CHD Risk.        ATP III CLASSIFICATION (LDL):  <100     mg/dL   Optimal  100-129  mg/dL   Near or Above                    Optimal  130-159  mg/dL   Borderline  160-189  mg/dL   High  >190     mg/dL   Very High Performed at [facility] [address] [place], [place] [address]   TSH     Status: None   Collection Time: 10/27/18  6:26 AM  Result Value Ref Range   TSH 1.343 0.350 - 4.500 uIU/mL    Comment:  Performed by a 3rd Generation assay with a functional sensitivity of <=0.01 uIU/mL. Performed at [facility], [place] [address], [place], [place] [address]     Blood Alcohol level:  No results found for: [facility]  Metabolic Disorder Labs:  Lab Results  Component Value Date   HGBA1C 4.6 (L) 10/27/2018   MPG 85.32 10/27/2018   No results found for: PROLACTIN Lab Results  Component Value Date   CHOL 139 10/27/2018   TRIG 44 10/27/2018   HDL 40 (L) 10/27/2018   CHOLHDL 3.5 10/27/2018   VLDL 9 10/27/2018   LDLCALC 90 10/27/2018    Current Medications: Current Facility-Administered Medications  Medication Dose Route Frequency Provider Last Rate Last Dose  . acetaminophen (TYLENOL) tablet 650 mg  650 mg Oral Q6H PRN Suella Broad, FNP      . alum & mag hydroxide-simeth (MAALOX/MYLANTA) 200-200-20 MG/5ML suspension 30 mL  30 mL Oral Q4H PRN Burt Ek, Gayland Curry, FNP      . hydrOXYzine (ATARAX/VISTARIL) tablet 25 mg  25 mg Oral TID PRN Starkes-Perry, Gayland Curry, FNP      . magnesium hydroxide (MILK OF MAGNESIA) suspension 30 mL  30 mL Oral Daily PRN Starkes-Perry, Gayland Curry, FNP      . traZODone (DESYREL) tablet 50 mg  50 mg Oral QHS PRN Starkes-Perry, Gayland Curry, FNP       PTA Medications: Medications Prior to Admission  Medication Sig Dispense Refill Last Dose  . esomeprazole (NEXIUM) 20 MG capsule Take 1 capsule by mouth daily.       Musculoskeletal: Strength & Muscle Tone: within normal limits Gait &  Station: normal Patient leans: N/A  Psychiatric Specialty Exam: Physical Exam  Nursing note and vitals reviewed. Constitutional: He is oriented to person, place, and time. He appears well-developed and well-nourished.  Cardiovascular: Normal rate.  Respiratory: Effort normal.  Neurological: He is alert and oriented to person, place, and time.    Review of Systems  Constitutional: Negative.   Respiratory: Negative.   Cardiovascular: Negative.    Psychiatric/Behavioral: Positive for substance abuse (drank 10+ beers and half a fifth of vodka prior to admission). Negative for depression, hallucinations, memory loss and suicidal ideas. The patient is not nervous/anxious and does not have insomnia.     Blood pressure (!) 136/96, pulse 84, temperature 98.4 F (36.9 C), temperature source Oral, resp. rate 18, height 5' 11"  (1.803 m), weight 79.4 kg, SpO2 96 %.Body mass index is 24.41 kg/m.  See MD's admission SRA    Treatment Plan Summary: Daily contact with patient to assess and evaluate symptoms and progress in treatment and Medication management  Recommend inpatient hospitalization.  Patient will participate in the therapeutic group milieu.  Discharge disposition in progress.   Observation Level/Precautions:  15 minute checks  Laboratory:  Reviewed  Psychotherapy:  Group therapy  Medications:  See MAR  Consultations:  PRN  Discharge Concerns:  Safety and stabilization  Estimated LOS: 3-5 days  Other:     Physician Treatment Plan for Primary Diagnosis: Alcohol abuse Long Term Goal(s): Improvement in symptoms so as ready for discharge  Short Term Goals: Ability to identify changes in lifestyle to reduce recurrence of condition will improve, Ability to verbalize feelings will improve, Ability to disclose and discuss suicidal ideas and Ability to demonstrate self-control will improve  Physician Treatment Plan for Secondary Diagnosis: Principal Problem:   Alcohol abuse Active Problems:   MDD (major depressive disorder), recurrent episode, severe ([place])   Suicidal ideations  Long Term Goal(s): Improvement in symptoms so as ready for discharge  Short Term Goals: Ability to identify and develop effective coping behaviors will improve, Ability to maintain clinical measurements within normal limits will improve and Ability to identify triggers associated with substance abuse/mental health issues will improve  I certify that  inpatient services furnished can reasonably be expected to improve the patient's condition.    Connye Burkitt, NP 1/6/20202:53 PM   I have discussed case with NP and have met with patient  Agree with NP note and assessment  22 year old male , single, no children, lives with GF, employed. Patient reports he had a recent argument with his GF, who threatened to break up and left the home. This occurred 2 days ago. Patient states he normally drinks only occasionally but on that day drank heavily, and states he considered overdosing on Acetaminophen and Ibuprofen, but states he did not actually take them because " I started vomiting because of the alcohol". States GF found him and called 911.  States that prior to above events he had not been feeling depressed or sad . Denies any suicidal ideations, any anhedonia, or significant neuro-vegetative symptoms. Denies any hallucinations or psychotic symptoms.  No prior psychiatric admissions, no history of suicide attempts, denies history of self cutting, denies history of significant depression,denies history of mania, denies history of panic or agoraphobia. States he had never been on psychiatric medications in the past . Denies drug abuse, reports he drinks alcohol only infrequently and denies pattern of abuse.  Denies medical illnesses, NKDA, does not smoke .   Dx- Suicidal Ideations  Plan- Inpatient  admission. We discussed medication options- patient denies depression at this time and states he does not feel he needs any standing psychiatric medications. Currently on Vistaril and Trazodone PRNs

## 2018-10-27 NOTE — Progress Notes (Signed)
Recreation Therapy Notes  Date: 1.6.20 Time: 0930 Location: [address] Dayroom  Group Topic: Stress Management  Goal Area(s) Addresses:  Patient will engage in healthy stress management techniques. Patient will be able to identify positive stress management techniques.  Behavioral Response: Engaged  Intervention: Stress Management  Activity : Meditation.  LRT introduced the stress management technique of meditation.  LRT played Padilla meditation that focused on looking at each day as Padilla new beginning.  Patients were to follow along as meditation was played in order to engage in activity.  Education:  Stress Management, Discharge Planning.   Education Outcome: Acknowledges Education  Clinical Observations/Feedback: Pt attended and participated in activity.    Kerry Padilla, LRT/CTRS         Kerry Padilla 10/27/2018 10:48 AM 

## 2018-10-27 NOTE — [facility] Suicide Risk Assessment (Signed)
[facility] Admission Suicide Risk Assessment   Nursing information obtained from:  Patient Demographic factors:  Male Current Mental Status:  Suicidal ideation indicated by patient Loss Factors:  Financial problems / change in socioeconomic status Historical Factors:  NA Risk Reduction Factors:  Living with another person, especially a relative, Positive therapeutic relationship  Total Time spent with patient: 45 minutes Principal Problem: Diagnosis:  Active Problems:   MDD (major depressive disorder), recurrent episode, severe (HCC)  Subjective Data:   Continued Clinical Symptoms:  Alcohol Use Disorder Identification Test Final Score (AUDIT): 0 The "Alcohol Use Disorders Identification Test", Guidelines for Use in Primary Care, Second Edition.  World Science writer [facility]). Score between 0-7:  no or low risk or alcohol related problems. Score between 8-15:  moderate risk of alcohol related problems. Score between 16-19:  high risk of alcohol related problems. Score 20 or above:  warrants further diagnostic evaluation for alcohol dependence and treatment.   CLINICAL FACTORS:  23 year old male , single, no children, lives with GF, employed. Patient reports he had a recent argument with his GF, who threatened to break up and left the home. This occurred 2 days ago. Patient states he normally drinks only occasionally but on that day drank heavily, and states he considered overdosing on Acetaminophen and Ibuprofen, but states he did not actually take them because " I started vomiting because of the alcohol". States GF found him and called 911.  States that prior to above events he had not been feeling depressed or sad . Denies any suicidal ideations, any anhedonia, or significant neuro-vegetative symptoms. Denies any hallucinations or psychotic symptoms.  No prior psychiatric admissions, no history of suicide attempts, denies history of self cutting, denies history of significant  depression,denies history of mania, denies history of panic or agoraphobia. States he had never been on psychiatric medications in the past . Denies drug abuse, reports he drinks alcohol only infrequently and denies pattern of abuse.  Denies medical illnesses, NKDA, does not smoke .   Dx- Suicidal Ideations  Plan- Inpatient admission. We discussed medication options- patient denies depression at this time and states he does not feel he needs any standing psychiatric medications. Currently on Vistaril and Trazodone PRNs     Musculoskeletal: Strength & Muscle Tone: within normal limits Gait & Station: normal Patient leans: N/A  Psychiatric Specialty Exam: Physical Exam  ROS no headache, no chest pain, no shortness of breath, no vomiting , no rash , no fever or chills   Blood pressure (!) 136/96, pulse 84, temperature 98.4 F (36.9 C), temperature source Oral, resp. rate 18, height 5\' 11"  (1.803 m), weight 79.4 kg, SpO2 96 %.Body mass index is 24.41 kg/m.  General Appearance: Well Groomed  Eye Contact:  Good  Speech:  Normal Rate  Volume:  Normal  Mood:  denies feeling depressed at this time , states " I feel OK"  Affect:  Appropriate and vaguely anxious   Thought Process:  Linear and Descriptions of Associations: Intact  Orientation:  Other:  fully alert and attentive  Thought Content:  no hallucinations, no delusions   Suicidal Thoughts:  No denies suicidal or self injurious ideations, denies homicidal or violent ideations  Homicidal Thoughts:  No  Memory:  recent and remote grossly intact   Judgement:  Other:  fair- improving   Insight:  Fair  Psychomotor Activity:  Normal  Concentration:  Concentration: Good and Attention Span: Good  Recall:  Good  Fund of Knowledge:  Good  Language:  Good  Akathisia:  Negative  Handed:  Right  AIMS (if indicated):     Assets:  Communication Skills Desire for Improvement Resilience  ADL's:  Intact  Cognition:  WNL  Sleep:  Number of  Hours: 6.75      COGNITIVE FEATURES THAT CONTRIBUTE TO RISK:  Closed-mindedness and Loss of executive function    SUICIDE RISK:   Moderate:  Frequent suicidal ideation with limited intensity, and duration, some specificity in terms of plans, no associated intent, good self-control, limited dysphoria/symptomatology, some risk factors present, and identifiable protective factors, including available and accessible social support.  PLAN OF CARE: Patient will be admitted to inpatient psychiatric unit for stabilization and safety. Will provide and encourage milieu participation. Provide medication management and maked adjustments as needed.  Will follow daily.    I certify that inpatient services furnished can reasonably be expected to improve the patient's condition.   Craige Cotta, MD 10/27/2018, 1:38 PM

## 2018-10-27 NOTE — Tx Team (Signed)
Interdisciplinary Treatment and Diagnostic Plan Update  10/27/2018 Time of Session: Kerry Padilla MRN: [id number]  Principal Diagnosis: <principal problem not specified>  Secondary Diagnoses: Active Problems:   MDD (major depressive disorder), recurrent episode, severe (HCC)   Current Medications:  Current Facility-Administered Medications  Medication Dose Route Frequency Provider Last Rate Last Dose  . acetaminophen (TYLENOL) tablet 650 mg  650 mg Oral Q6H PRN Suella Broad, FNP      . alum & mag hydroxide-simeth (MAALOX/MYLANTA) 200-200-20 MG/5ML suspension 30 mL  30 mL Oral Q4H PRN Burt Ek, Gayland Curry, FNP      . hydrOXYzine (ATARAX/VISTARIL) tablet 25 mg  25 mg Oral TID PRN Starkes-Perry, Gayland Curry, FNP      . magnesium hydroxide (MILK OF MAGNESIA) suspension 30 mL  30 mL Oral Daily PRN Starkes-Perry, Gayland Curry, FNP      . traZODone (DESYREL) tablet 50 mg  50 mg Oral QHS PRN Starkes-Perry, Gayland Curry, FNP       PTA Medications: Medications Prior to Admission  Medication Sig Dispense Refill Last Dose  . esomeprazole (NEXIUM) 20 MG capsule Take 1 capsule by mouth daily.       Patient Stressors: Marital or family conflict Occupational concerns  Patient Strengths: Ability for Estate manager/land agent for treatment/growth Special hobby/interest Supportive family/friends  Treatment Modalities: Medication Management, Group therapy, Case management,  1 to 1 session with clinician, Psychoeducation, Recreational therapy.   Physician Treatment Plan for Primary Diagnosis: <principal problem not specified> Long Term Goal(s):     Short Term Goals:    Medication Management: Evaluate patient's response, side effects, and tolerance of medication regimen.  Therapeutic Interventions: 1 to 1 sessions, Unit Group sessions and Medication administration.  Evaluation of Outcomes: Not Met  Physician Treatment Plan for Secondary Diagnosis: Active Problems:   MDD  (major depressive disorder), recurrent episode, severe ([facility])  Long Term Goal(s):     Short Term Goals:       Medication Management: Evaluate patient's response, side effects, and tolerance of medication regimen.  Therapeutic Interventions: 1 to 1 sessions, Unit Group sessions and Medication administration.  Evaluation of Outcomes: Not Met   RN Treatment Plan for Primary Diagnosis: <principal problem not specified> Long Term Goal(s): Knowledge of disease and therapeutic regimen to maintain health will improve  Short Term Goals: Ability to participate in decision making will improve, Ability to verbalize feelings will improve, Ability to disclose and discuss suicidal ideas and Ability to identify and develop effective coping behaviors will improve  Medication Management: RN will administer medications as ordered by provider, will assess and evaluate patient's response and provide education to patient for prescribed medication. RN will report any adverse and/or side effects to prescribing provider.  Therapeutic Interventions: 1 on 1 counseling sessions, Psychoeducation, Medication administration, Evaluate responses to treatment, Monitor vital signs and CBGs as ordered, Perform/monitor CIWA, COWS, AIMS and Fall Risk screenings as ordered, Perform wound care treatments as ordered.  Evaluation of Outcomes: Not Met   LCSW Treatment Plan for Primary Diagnosis: <principal problem not specified> Long Term Goal(s): Safe transition to appropriate next level of care at discharge, Engage patient in therapeutic group addressing interpersonal concerns.  Short Term Goals: Engage patient in aftercare planning with referrals and resources  Therapeutic Interventions: Assess for all discharge needs, 1 to 1 time with Social worker, Explore available resources and support systems, Assess for adequacy in community support network, Educate family and significant other(s) on suicide prevention, Complete  Psychosocial Assessment, Interpersonal group therapy.  Evaluation of Outcomes: Progressing   Progress in Treatment: Attending groups: No. Participating in groups: No. Taking medication as prescribed: Yes. Toleration medication: Yes. Family/Significant other contact made: No, will contact:  the patient's fiance' Patient understands diagnosis: Yes. Discussing patient identified problems/goals with staff: Yes. Medical problems stabilized or resolved: Yes. Denies suicidal/homicidal ideation: Yes. Issues/concerns per patient self-inventory: No. Other:   New problem(s) identified: None   New Short Term/Long Term Goal(s):  medication stabilization, elimination of SI thoughts, development of comprehensive mental wellness plan.   Patient Goals:  Help with dealing with stress  Discharge Plan or Barriers: Patient plans to return home with his fiance and follow up with [facility] for outpatient medication management and therapy services.   Reason for Continuation of Hospitalization: Anxiety Depression Medication stabilization Suicidal ideation  Estimated Length of Stay: 2-3 days   Attendees: Patient: 10/27/2018 11:17 AM  Physician: Dr. Neita Garnet, MD 10/27/2018 11:17 AM  Nursing: Yetta Flock.Jacinto Reap, RN  10/27/2018 11:17 AM  RN Care Manager: 10/27/2018 11:17 AM  Social Worker: Radonna Ricker, [place] 10/27/2018 11:17 AM  Recreational Therapist:  10/27/2018 11:17 AM  Other: Mable Paris, NP  10/27/2018 11:17 AM  Other:  10/27/2018 11:17 AM  Other: 10/27/2018 11:17 AM    Scribe for Treatment Team: Marylee Floras, [place] 10/27/2018 11:17 AM

## 2018-10-27 NOTE — [facility] Group Notes (Signed)
LCSW Group Therapy Note 10/27/2018 11:35 AM  Type of Therapy and Topic: Group Therapy: Overcoming Obstacles  Participation Level: Active  Description of Group:  In this group patients will be encouraged to explore what they see as obstacles to their own wellness and recovery. They will be guided to discuss their thoughts, feelings, and behaviors related to these obstacles. The group will process together ways to cope with barriers, with attention given to specific choices patients can make. Each patient will be challenged to identify changes they are motivated to make in order to overcome their obstacles. This group will be process-oriented, with patients participating in exploration of their own experiences as well as giving and receiving support and challenge from other group members.  Therapeutic Goals: 1. Patient will identify personal and current obstacles as they relate to admission. 2. Patient will identify barriers that currently interfere with their wellness or overcoming obstacles.  3. Patient will identify feelings, thought process and behaviors related to these barriers. 4. Patient will identify two changes they are willing to make to overcome these obstacles:   Summary of Patient Progress Remone was engaged and participated throughout the group session. Cornellius states that his main obstacle is "my finances and my stress". Tavar states that he frequently suppress his feelings and that he does not release those emotions which causes more stress. Amram also reports that he has to become more financially disciplined in order to overcome his obstacles.     Therapeutic Modalities:  Cognitive Behavioral Therapy Solution Focused Therapy Motivational Interviewing Relapse Prevention Therapy   Alcario Drought Clinical Social Worker

## 2018-10-27 NOTE — Progress Notes (Signed)
Patient self inventory- Patient slept well last night, sleep medication was not requested. Appetite is good, energy level normal, concentration good. Depression, hopelessness, and anxiety rated 2, 0, 3 out of 10. Denies withdrawal symptoms. Denies physical pain. Patient's goal is "talking to the physician and coming up with a plan of care for after I leave here." Patient's going to "talk to the physician and ask questions. Patient is compliant with medications as ordered by provider. No side effects noted. Safety is maintained with 15 minute checks as well as environmental checks. Will continue to monitor and provide support.

## 2018-10-27 NOTE — Progress Notes (Signed)
Pt is friendly, cooperative and open in discussing his situation.  Pt currently denies any pain, SI, HI or AVH.  Pt declines any medication for anxiety or sleep.  Pt sts he is working out his problems with his fiance that precipitated his admission for OD. Pt offered education, support and encouragement Pt remains safe on unit

## 2018-10-28 DIAGNOSIS — F419 Anxiety disorder, unspecified: Secondary | ICD-10-CM

## 2018-10-28 DIAGNOSIS — G4700 Insomnia, unspecified: Secondary | ICD-10-CM

## 2018-10-28 NOTE — Plan of Care (Signed)
Progress note  D: pt found in the dayroom; compliant with medication administration. Pt states he slept well. Pt rates his depression/hopelessness/anxiety a 0/0/7 out of 10 respectively. Pt denies any physical symptoms or pain, rating this a 0/10. Pt states his goal for today is to learn more stress relievers and will achieve this by attending stress management group. Pt denies any si/hi/ah/vh and verbally agrees to approach staff if these become apparent or before harming himself or others [facility].  A: pt provided support and encouragement. Pt given medication per protocol and standing orders. [id number] safety checks implemented and continued.  R: pt safe on the unit. Will continue to monitor.   Pt progressing in the following metrics  Problem: Education: Goal: Ability to make informed decisions regarding treatment will improve Outcome: Progressing   Problem: Coping: Goal: Coping ability will improve Outcome: Progressing   Problem: Health Behavior/Discharge Planning: Goal: Identification of resources available to assist in meeting health care needs will improve Outcome: Progressing   Problem: Medication: Goal: Compliance with prescribed medication regimen will improve Outcome: Progressing

## 2018-10-28 NOTE — [facility] Group Notes (Signed)
Adult Psychoeducational Group Note  Date:  10/28/2018 Time:  9:05 AM  Group Topic/Focus:  Orientation:   The focus of this group is to educate the patient on the purpose and policies of crisis stabilization and provide a format to answer questions about their admission.  The group details unit policies and expectations of patients while admitted.  Participation Level:  Active  Participation Quality:  Appropriate  Affect:  Appropriate  Cognitive:  Alert  Insight: Appropriate  Engagement in Group:  Engaged  Modes of Intervention:  Orientation  Additional Comments:   Pt participated in orientation group facilitated by MHT Macdilla W.  Dellia Nims 10/28/2018, 9:05 AM

## 2018-10-28 NOTE — Progress Notes (Signed)
[facility] MD Progress Note  10/28/2018 2:01 PM Kerry Padilla  MRN:  [id number] Subjective: Patient reports she is feeling well and currently denies/minimizes depression.  He denies suicidal ideations.  He is future oriented and focused on discharging soon in order to return to work and reunite with his girlfriend. Objective: I have discussed case with treatment team and have met with patient. 23 year old male, presented to the hospital related to OTC analgesic overdose, which was impulsive, unplanned and in the context of argument with GF, heavy drinking. Patient denies pattern of alcohol abuse, and states alcohol intoxication was isolated episode. He continues to deny overdosing and states he never actually took the tablets. At this time he presents euthymic with a full range of affect.  He is hopeful for discharge soon. He states that he has resolved differences with his girlfriend and that the relationship is stable again (relationship conflict have been a major stressor prior to admission). No disruptive or agitated behaviors on unit.  Pleasant on approach. He is not on any standing psychiatric medications at this time.  Principal Problem: Alcohol abuse Diagnosis: Principal Problem:   Alcohol abuse Active Problems:   MDD (major depressive disorder), recurrent episode, severe ([place])   Suicidal ideations  Total Time spent with patient: 20 minutes  Past Psychiatric History:   Past Medical History:  Past Medical History:  Diagnosis Date  . GERD (gastroesophageal reflux disease)    TUMS  as needed  . Metacarpal bone fracture 03/29/2018   right 5th    Past Surgical History:  Procedure Laterality Date  . CLOSED REDUCTION METACARPAL WITH PERCUTANEOUS PINNING Right 04/08/2018   Procedure: CLOSED REDUCTION METACARPAL WITH PERCUTANEOUS PINNING SMALL FINGER;  Surgeon: Renette Butters, MD;  Location: [place];  Service: Orthopedics;  Laterality: Right;  . TYMPANOSTOMY TUBE  PLACEMENT     Family History:  Family History  Problem Relation Age of Onset  . Other Maternal Grandmother        Died at 28 due to respiratory failure  . Heart Problems Maternal Grandfather        MI died at 48   Family Psychiatric  History:  Social History:  Social History   Substance and Sexual Activity  Alcohol Use Never  . Frequency: Never     Social History   Substance and Sexual Activity  Drug Use Never    Social History   Socioeconomic History  . Marital status: Single    Spouse name: n/a  . Number of children: 0  . Years of education: Not on file  . Highest education level: Not on file  Occupational History  . Occupation: Lexicographer: UNEMPLOYED    Comment: [employer]  Social Needs  . Financial resource strain: Not on file  . Food insecurity:    Worry: Not on file    Inability: Not on file  . Transportation needs:    Medical: Not on file    Non-medical: Not on file  Tobacco Use  . Smoking status: Never Smoker  . Smokeless tobacco: Current User    Types: Snuff  Substance and Sexual Activity  . Alcohol use: Never    Frequency: Never  . Drug use: Never  . Sexual activity: Yes    Partners: Female    Birth control/protection: Condom  Lifestyle  . Physical activity:    Days per week: Not on file    Minutes per session: Not on file  . Stress: Not on file  Relationships  . Social connections:    Talks on phone: Not on file    Gets together: Not on file    Attends religious service: Not on file    Active member of club or organization: Not on file    Attends meetings of clubs or organizations: Not on file    Relationship status: Not on file  Other Topics Concern  . Not on file  Social History Narrative   Lives with his mother and younger sister (2011).  No contact with his father.   Additional Social History:    Pain Medications: See MAR Prescriptions: See MAR Over the Counter: See MAR History of alcohol / drug use?: Yes Name of  Substance 1: alcohol 1 - Frequency: UTA 1 - Last Use / Amount: 10/25/18  Sleep: Good  Appetite:  Good  Current Medications: Current Facility-Administered Medications  Medication Dose Route Frequency Provider Last Rate Last Dose  . acetaminophen (TYLENOL) tablet 650 mg  650 mg Oral Q6H PRN Suella Broad, FNP      . alum & mag hydroxide-simeth (MAALOX/MYLANTA) 200-200-20 MG/5ML suspension 30 mL  30 mL Oral Q4H PRN Burt Ek, Gayland Curry, FNP      . hydrOXYzine (ATARAX/VISTARIL) tablet 25 mg  25 mg Oral TID PRN Starkes-Perry, Gayland Curry, FNP      . magnesium hydroxide (MILK OF MAGNESIA) suspension 30 mL  30 mL Oral Daily PRN Starkes-Perry, Gayland Curry, FNP      . traZODone (DESYREL) tablet 50 mg  50 mg Oral QHS PRN Suella Broad, FNP        Lab Results:  Results for orders placed or performed during the hospital encounter of 10/26/18 (from the past 48 hour(s))  Hemoglobin A1c     Status: Abnormal   Collection Time: 10/27/18  6:26 AM  Result Value Ref Range   Hgb A1c MFr Bld 4.6 (L) 4.8 - 5.6 %    Comment: (NOTE) Pre diabetes:          5.7%-6.4% Diabetes:              >6.4% Glycemic control for   <7.0% adults with diabetes    Mean Plasma Glucose 85.32 mg/dL    Comment: Performed at [facility], [place] [address] [place], [place] [address]  Lipid panel     Status: Abnormal   Collection Time: 10/27/18  6:26 AM  Result Value Ref Range   Cholesterol 139 0 - 200 mg/dL   Triglycerides 44 <150 mg/dL   HDL 40 (L) >40 mg/dL   Total CHOL/HDL Ratio 3.5 RATIO   VLDL 9 0 - 40 mg/dL   LDL Cholesterol 90 0 - 99 mg/dL    Comment:        Total Cholesterol/HDL:CHD Risk Coronary Heart Disease Risk Table                     Men   Women  1/2 Average Risk   3.4   3.3  Average Risk       5.0   4.4  2 X Average Risk   9.6   7.1  3 X Average Risk  23.4   11.0        Use the calculated Patient Ratio above and the CHD Risk Table to determine the patient's CHD Risk.        ATP III  CLASSIFICATION (LDL):  <100     mg/dL   Optimal  100-129  mg/dL  Near or Above                    Optimal  130-159  mg/dL   Borderline  160-189  mg/dL   High  >190     mg/dL   Very High Performed at [facility] [address] [place], [place] [address]   TSH     Status: None   Collection Time: 10/27/18  6:26 AM  Result Value Ref Range   TSH 1.343 0.350 - 4.500 uIU/mL    Comment: Performed by a 3rd Generation assay with a functional sensitivity of <=0.01 uIU/mL. Performed at [facility], [place] [address], [place], [place] [address]     Blood Alcohol level:  No results found for: [facility]  Metabolic Disorder Labs: Lab Results  Component Value Date   HGBA1C 4.6 (L) 10/27/2018   MPG 85.32 10/27/2018   No results found for: PROLACTIN Lab Results  Component Value Date   CHOL 139 10/27/2018   TRIG 44 10/27/2018   HDL 40 (L) 10/27/2018   CHOLHDL 3.5 10/27/2018   VLDL 9 10/27/2018   LDLCALC 90 10/27/2018    Physical Findings: AIMS: Facial and Oral Movements Muscles of Facial Expression: None, normal Lips and Perioral Area: None, normal Jaw: None, normal Tongue: None, normal,Extremity Movements Upper (arms, wrists, hands, fingers): None, normal Lower (legs, knees, ankles, toes): None, normal, Trunk Movements Neck, shoulders, hips: None, normal, Overall Severity Severity of abnormal movements (highest score from questions above): None, normal Incapacitation due to abnormal movements: None, normal Patient's awareness of abnormal movements (rate only patient's report): No Awareness, Dental Status Current problems with teeth and/or dentures?: No Does patient usually wear dentures?: No  CIWA:    COWS:     Musculoskeletal: Strength & Muscle Tone: within normal limits Gait & Station: normal Patient leans: N/A  Psychiatric Specialty Exam: Physical Exam  ROS no headache, no chest pain, no shortness of breath  Blood pressure 139/81, pulse  71, temperature 98.3 F (36.8 C), temperature source Oral, resp. rate 16, height 5' 11"  (1.803 m), weight 79.4 kg, SpO2 99 %.Body mass index is 24.41 kg/m.  General Appearance: Well Groomed  Eye Contact:  Good  Speech:  Normal Rate  Volume:  Normal  Mood:  Reports mood as normal, denies depression, presents euthymic  Affect:  Appropriate and Full Range  Thought Process:  Linear and Descriptions of Associations: Intact  Orientation:  Full (Time, Place, and Person)  Thought Content:  No hallucinations, no delusions, not internally preoccupied  Suicidal Thoughts:  No denies suicidal or self-injurious ideations, denies homicidal or violent ideations, contracts for safety on unit  Homicidal Thoughts:  No  Memory:  Recent and remote grossly intact  Judgement:  Fair-improving  Insight:  Fair  Psychomotor Activity:  Normal  Concentration:  Concentration: Good and Attention Span: Good  Recall:  Good  Fund of Knowledge:  Good  Language:  Good  Akathisia:  Negative  Handed:  Right  AIMS (if indicated):     Assets:  Communication Skills Desire for Improvement Resilience  ADL's:  Intact  Cognition:  WNL  Sleep:  Number of Hours: 6.75   Assessment- 23 year old male, presented to the hospital related to OTC analgesic overdose, which was impulsive, unplanned and in the context of argument with GF, heavy drinking. Patient denies pattern of alcohol abuse, and states alcohol intoxication was isolated episode. He continues to deny overdosing and states he never actually took the tablets.  Patient continues to describe recent episode as isolated and in the context of argument/tension with girlfriend.  Minimizes depression or neurovegetative symptoms and currently presents euthymic, with a full range of affect, not endorsing any significant neurovegetative symptoms.  He is hopeful for discharge soon.  Currently he is not on any standing psychiatric medications. Treatment Plan Summary: Daily contact  with patient to assess and evaluate symptoms and progress in treatment, Medication management, Plan Inpatient treatment and Medications as below Encourage group and milieu participation to work on coping skills and symptom reduction Have reviewed negative impact that alcohol intoxication likely had on events that led to admission Continue Vistaril 25 mg 3 times daily PRN for anxiety Continue Trazodone 50 mg nightly PRN for insomnia as needed Treatment team working on disposition options Jenne Campus, MD 10/28/2018, 2:01 PM

## 2018-10-28 NOTE — Progress Notes (Signed)
[facility] Group Notes:  (Nursing/MHT/Case Management/Adjunct)  Date:  10/28/2018  Time:  1515  Type of Therapy:  Nurse Education  Participation Level:  Active  Participation Quality:  Appropriate  Affect:  Appropriate  Cognitive:  Appropriate  Insight:  Appropriate  Engagement in Group:  Engaged  Modes of Intervention:  Activity   

## 2018-10-28 NOTE — [facility] Suicide Risk Assessment (Signed)
[facility] INPATIENT:  Family/Significant Other Suicide Prevention Education  Suicide Prevention Education:  Education Completed;  Johnsie Cancel, fiance' [phone]) has been identified by the patient as the family member/significant other with whom the patient will be residing, and identified as the person(s) who will aid the patient in the event of a mental health crisis (suicidal ideations/suicide attempt).  With written consent from the patient, the family member/significant other has been provided the following suicide prevention education, prior to the and/or following the discharge of the patient.  The suicide prevention education provided includes the following:  Suicide risk factors  Suicide prevention and interventions  National Suicide Hotline telephone number  [facility] assessment telephone number  [facility] Emergency Assistance 911  [facility] and/or Residential Mobile Crisis Unit telephone number  Request made of family/significant other to:  Remove weapons (e.g., guns, rifles, knives), all items previously/currently identified as safety concern.    Remove drugs/medications (over-the-counter, prescriptions, illicit drugs), all items previously/currently identified as a safety concern.  The family member/significant other verbalizes understanding of the suicide prevention education information provided.  The family member/significant other agrees to remove the items of safety concern listed above.  Maeola Sarah 10/28/2018, 2:47 PM

## 2018-10-28 NOTE — Progress Notes (Signed)
D: Pt spent majority of evening in his room A: Pt was offered support and encouragement. Pt was given scheduled medications. Pt was encourage to attend groups. Q 15 minute checks were done for safety.  R: safety maintained on unit.

## 2018-10-28 NOTE — Progress Notes (Signed)
D: Pt denies SI/HI/AVH. Pt is pleasant and cooperative. Pt stated he was feeling better, pt stated he was getting good information on how to deal with stress A: Pt was offered support and encouragement. . Pt was encourage to attend groups. Q 15 minute checks were done for safety.  R:Pt attends groups and interacts well with peers and staff. Pt is taking medication. Pt has no complaints.Pt receptive to treatment and safety maintained on unit.  Problem: Coping: Goal: Coping ability will improve Outcome: Progressing   Problem: Health Behavior/Discharge Planning: Goal: Identification of resources available to assist in meeting health care needs will improve Outcome: Progressing   Problem: Self-Concept: Goal: Ability to disclose and discuss suicidal ideas will improve Outcome: Progressing   Problem: Education: Goal: Emotional status will improve Outcome: Progressing

## 2018-10-29 NOTE — [facility] Suicide Risk Assessment (Signed)
[facility]   Principal Problem: Alcohol abuse Discharge Diagnoses: Principal Problem:   Alcohol abuse Active Problems:   MDD (major depressive disorder), recurrent episode, severe ([facility])   Suicidal ideations   Total Time spent with patient: 30 minutes  Musculoskeletal: Strength & Muscle Tone: within normal limits Gait & Station: normal Patient leans: N/A  Psychiatric Specialty Exam: ROS patient denies headache, denies chest pain, denies shortness of breath, denies nausea or vomiting, no fever or chills, no rash  Blood pressure (!) 144/92, pulse 98, temperature 98.3 F (36.8 C), temperature source Oral, resp. rate 17, height 5\' 11"  (1.803 m), weight 79.4 kg, SpO2 100 %.Body mass index is 24.41 kg/m.  General Appearance: Well Groomed  Eye Contact::  Good  Speech:  Normal Rate409  Volume:  Normal  Mood:  Reports improved mood and presents euthymic  Affect:  Full in range, appropriate  Thought Process:  Linear and Descriptions of Associations: Intact  Orientation:  Full (Time, Place, and Person)  Thought Content:  No hallucinations, no delusions expressed  Suicidal Thoughts:  No denies suicidal or self-injurious ideations, denies homicidal or violent ideations  Homicidal Thoughts:  No  Memory:  Recent and remote grossly intact  Judgement:  Other:  Improving  Insight:  Improving  Psychomotor Activity:  Normal  Concentration:  Good  Recall:  Good  Fund of Knowledge:Good  Language: Good  Akathisia:  Negative  Handed:  Right  AIMS (if indicated):     Assets:  Communication Skills Desire for Improvement Physical Health Resilience Social Support  Sleep:  Number of Hours: 6.5  Cognition: WNL  ADL's:  Intact   Mental Status Per Nursing Assessment::   On Admission:  Suicidal ideation indicated by patient  Demographic Factors:  23 year old single male, no children, lives with girlfriend, employed  Loss Factors: Argument with  girlfriend  Historical Factors: Denies prior psychiatric history  Risk Reduction Factors:   Sense of responsibility to family, Employed, Living with another person, especially a relative, Positive social support and Positive coping skills or problem solving skills  Continued Clinical Symptoms:  At this time patient presents alert, attentive, calm, pleasant, euthymic, denies depression, affect is full in range, no thought disorder, no suicidal or self-injurious ideations, no homicidal or violent ideations, no hallucinations, no delusions, future oriented, looking forward to returning home and returning to work in the near future. Behavior on unit in good control, pleasant on approach. Patient is not currently on any standing psychiatric medication. With his expressed consent I have spoken with his girlfriend who has visited him on unit and who corroborates that patient seems much improved, at baseline, and agrees with discharge planning today. Patient expresses increasing insight regarding factors that contributed to decompensation/admission.  States he realizes that drinking on that day was "a mistake" and that alcohol intoxication likely contributed to presentation.  States he has a good support network ( mother, good friends) he can contact at any time if he feels upset.   Cognitive Features That Contribute To Risk:  No gross cognitive deficits noted upon discharge. Is alert , attentive, and oriented x 3   Suicide Risk:  Mild:  Suicidal ideation of limited frequency, intensity, duration, and specificity.  There are no identifiable plans, no associated intent, mild dysphoria and related symptoms, good self-control (both objective and subjective assessment), few other risk factors, and identifiable protective factors, including available and accessible social support.  Follow-up Information    [facility]  Why:  Your hospital follow up appointment is  Tuesday, 11/04/18 at 9:45a. Please bring: photo ID, proof of insurance and income, social security card, and any discharge paperwork. Contact information: [address] [place] [address] [phone]           Plan Of Care/Follow-up recommendations:  Activity:  As tolerated Diet:  Regular Tests:  NA Other:  See below  Patient is expressing readiness for discharge and is leaving unit in good spirits, there are no current grounds for ongoing involuntary commitment.  Craige Cotta, MD 10/29/2018, 10:54 AM

## 2018-10-29 NOTE — Progress Notes (Signed)
Patient ID: Kerry Padilla, male   DOB: [birth year], 23 y.o.   MRN: [id number]  D: Pt alert and oriented on the unit.   A: Education, support, and encouragement provided. Discharge summary, medications and follow up appointments reviewed with pt. Suicide prevention resources provided, including "My 3 App." Pt had no belongings and no locker assigned on admission. Belongings sheet signed.  R: Pt denies SI/HI, A/VH, pain, or any concerns at this time. Pt ambulatory on and off unit. Pt discharged to lobby.

## 2018-10-29 NOTE — Progress Notes (Signed)
  [facility] Adult Case Management Discharge Plan :  Will you be returning to the same living situation after discharge:  Yes,  patient reports he is returning home with his fiance' At discharge, do you have transportation home?: Yes,  patient reports his fiance is picking him up at discharge Do you have the ability to pay for your medications: Yes,  income from employment  Release of information consent forms completed and in the chart;  Patient's signature needed at discharge.  Patient to Follow up at: Follow-up Information    [facility] Go on 11/04/2018.   Why:  Your hospital follow up appointment is Tuesday, 11/04/18 at 9:45a. Please bring: photo ID, proof of insurance and income, social security card, and any discharge paperwork. Contact information: [address] [place] [address] [phone]           Next level of care provider has access to [facility] Link:yes  Safety Planning and Suicide Prevention discussed: Yes,  with the patient's fiance     Has patient been referred to the Quitline?: N/A patient is not a smoker  Patient has been referred for addiction treatment: N/A  Maeola Sarah, LCSWA 10/29/2018, 9:47 AM

## 2018-10-29 NOTE — Progress Notes (Signed)
Recreation Therapy Notes  Date: 1.8.20 Time: 0930 Location: [address] Dayroom  Group Topic: Stress Management  Goal Area(s) Addresses:  Patient will identify benefits of stress management. Patient will identify stress management techniques. Patient will identify benefits of using stress management post d/c.   Intervention: Stress Management  Activity :  Guided Imagery.  LRT introduced the stress management technique of guided imagery.  LRT read a script that took patients on a journey through a wildlife sanctuary.  Patients were to follow along as script was read to engage in activity.  Education:  Stress Management, Discharge Planning.   Education Outcome: Acknowledges Education  Clinical Observations/Feedback:  Pt did not attend group.    Caroll Rancher, LRT/CTRS         Caroll Rancher A 10/29/2018 10:53 AM

## 2018-10-29 NOTE — Discharge Summary (Addendum)
Physician Discharge Summary Note  Patient:  Kerry Padilla is an 23 y.o., male MRN:  [id number] DOB:  Jan 30, 1996 Patient phone:  [phone] (home)  Patient address:   [address] [place] [address],  Total Time spent with patient: 15 minutes  Date of Admission:  10/26/2018 Date of Discharge: 10/29/2018  Reason for Admission:  Alcohol overdose  Principal Problem: Alcohol abuse Discharge Diagnoses: Principal Problem:   Alcohol abuse Active Problems:   MDD (major depressive disorder), recurrent episode, severe (HCC)   Suicidal ideations   Past Psychiatric History: Per admission H&P: None. Denies history of depression, suicidal ideation. No history of hospitalizations, suicide attempts, self-injurious behaviors. No hx mania, panic attacks, violence, or psychotropic trials.  Past Medical History:  Past Medical History:  Diagnosis Date  . GERD (gastroesophageal reflux disease)    TUMS  as needed  . Metacarpal bone fracture 03/29/2018   right 5th    Past Surgical History:  Procedure Laterality Date  . CLOSED REDUCTION METACARPAL WITH PERCUTANEOUS PINNING Right 04/08/2018   Procedure: CLOSED REDUCTION METACARPAL WITH PERCUTANEOUS PINNING SMALL FINGER;  Surgeon: Sheral Apley, MD;  Location: [facility];  Service: Orthopedics;  Laterality: Right;  . TYMPANOSTOMY TUBE PLACEMENT     Family History:  Family History  Problem Relation Age of Onset  . Other Maternal Grandmother        Died at 2 due to respiratory failure  . Heart Problems Maternal Grandfather        MI died at 67   Family Psychiatric  History: denies Social History:  Social History   Substance and Sexual Activity  Alcohol Use Never  . Frequency: Never     Social History   Substance and Sexual Activity  Drug Use Never    Social History   Socioeconomic History  . Marital status: Single    Spouse name: n/a  . Number of children: 0  . Years of education: Not on file  . Highest education  level: Not on file  Occupational History  . Occupation: Dentist: UNEMPLOYED    Comment: [employer]  Social Needs  . Financial resource strain: Not on file  . Food insecurity:    Worry: Not on file    Inability: Not on file  . Transportation needs:    Medical: Not on file    Non-medical: Not on file  Tobacco Use  . Smoking status: Never Smoker  . Smokeless tobacco: Current User    Types: Snuff  Substance and Sexual Activity  . Alcohol use: Never    Frequency: Never  . Drug use: Never  . Sexual activity: Yes    Partners: Female    Birth control/protection: Condom  Lifestyle  . Physical activity:    Days per week: Not on file    Minutes per session: Not on file  . Stress: Not on file  Relationships  . Social connections:    Talks on phone: Not on file    Gets together: Not on file    Attends religious service: Not on file    Active member of club or organization: Not on file    Attends meetings of clubs or organizations: Not on file    Relationship status: Not on file  Other Topics Concern  . Not on file  Social History Narrative   Lives with his mother and younger sister (2011).  No contact with his father.    Hospital Course:  Per admission H&P  10/27/2018: Kerry Padilla is a 23 year old male with no psychiatric or chronic medical history. He presents for treatment under IVC. Per IVC paperwork, he consumed a bottle of Tylenol and a bottle of ibuprofen along with alcohol, and left a suicide note. On assessment today, he admits to heavy drinking (10+ beers and half a fifth of vodka) on Saturday night but denies overdose on medications. Kerry Padilla reports he and his live-in fiance had an argument on Saturday night 10/25/18 and decided to break up. She moved her things out of their place, and he began drinking beers by himself at home- "which obviously didn't make me feel better." He reports he became drunk and grabbed a bottle of Tylenol- "I was going to take 2 tablets  because I had a headache. Then I took the bottle in the bathroom and locked the door. I had some more beers and vodka. I started throwing up and knocked over the bottle of pills. Then I passed out on the floor. The cops and my fiance found me with the empty bottle on the floor." He denies taking any pills with the ETOH. He denies recent or history of depressed mood. Denies problems with sleep, energy, or appetite and states he enjoys his life. Reports his fiance was on her way back to the home to reconcile with him when she found him on the floor. He and his fiance have since reconciled. Denies SI, HI, AVH. He reports consuming ETOH "maybe once or twice a year," usually socially. He does not feel he needs to take any psychotropic medication.  Kerry Padilla was admitted after overdose on ETOH. He started drinking heavily at home alone after his fiance broke up with him. He reportedly was found unconscious by his fiance and EMS with open bottles of Tylenol and ibuprofen but denies taking the pills. He and his fiance reconciled afterwards. He denied recent or distant history of depression and declined medication. Also denied history of ETOH abuse. He remained on the [facility] unit for 3 days. He stabilized with therapy. He has shown improvement with improved mood and interaction. He denies any SI/HI/AVH and contracts for safety. He agrees to follow up at [facility].   Physical Findings: AIMS: Facial and Oral Movements Muscles of Facial Expression: None, normal Lips and Perioral Area: None, normal Jaw: None, normal Tongue: None, normal,Extremity Movements Upper (arms, wrists, hands, fingers): None, normal Lower (legs, knees, ankles, toes): None, normal, Trunk Movements Neck, shoulders, hips: None, normal, Overall Severity Severity of abnormal movements (highest score from questions above): None, normal Incapacitation due to abnormal movements: None, normal Patient's awareness of abnormal movements (rate only  patient's report): No Awareness, Dental Status Current problems with teeth and/or dentures?: No Does patient usually wear dentures?: No  CIWA:    COWS:     Musculoskeletal: Strength & Muscle Tone: within normal limits Gait & Station: normal Patient leans: N/A  Psychiatric Specialty Exam: Physical Exam  Nursing note and vitals reviewed. Constitutional: He is oriented to person, place, and time. He appears well-developed and well-nourished.  Cardiovascular: Normal rate.  Respiratory: Effort normal.  Neurological: He is alert and oriented to person, place, and time.    Review of Systems  Constitutional: Negative.   Respiratory: Negative.   Cardiovascular: Negative.   Psychiatric/Behavioral: Positive for substance abuse (ETOH overdose). Negative for depression, hallucinations, memory loss and suicidal ideas. The patient is not nervous/anxious and does not have insomnia.     Blood pressure (!) 144/92, pulse 98, temperature 98.3 F (  36.8 C), temperature source Oral, resp. rate 17, height 5\' 11"  (1.803 m), weight 79.4 kg, SpO2 100 %.Body mass index is 24.41 kg/m.  See discharge SRA        Has this patient used any form of tobacco in the last 30 days? (Cigarettes, Smokeless Tobacco, Cigars, and/or Pipes)  No  Blood Alcohol level:  No results found for: [facility]  Metabolic Disorder Labs:  Lab Results  Component Value Date   HGBA1C 4.6 (L) 10/27/2018   MPG 85.32 10/27/2018   No results found for: PROLACTIN Lab Results  Component Value Date   CHOL 139 10/27/2018   TRIG 44 10/27/2018   HDL 40 (L) 10/27/2018   CHOLHDL 3.5 10/27/2018   VLDL 9 10/27/2018   LDLCALC 90 10/27/2018    See Psychiatric Specialty Exam and Suicide Risk Assessment completed by Attending Physician prior to discharge.  Discharge destination:  Home  Is patient on multiple antipsychotic therapies at discharge:  No   Has Patient had three or more failed trials of antipsychotic monotherapy by history:   No  Recommended Plan for Multiple Antipsychotic Therapies: NA  Discharge Instructions    Discharge instructions   Complete by:  As directed    Patient is instructed to take all prescribed medications as recommended. Report any side effects or adverse reactions to your outpatient psychiatrist. Patient is instructed to abstain from alcohol and illegal drugs while on prescription medications. In the event of worsening symptoms, patient is instructed to call the crisis hotline, 911, or go to the nearest emergency department for evaluation and treatment.     Allergies as of 10/29/2018      Reactions   Strawberry Extract Swelling   SWELLING OF THROAT, NO SOB      Medication List    TAKE these medications     Indication  esomeprazole 20 MG capsule Commonly known as:  NEXIUM Take 1 capsule by mouth daily.  Indication:  Gastroesophageal Reflux Disease      Follow-up Information    [facility].   Why:  Your hospital follow up appointment is Tuesday, 11/04/18 at 9:45a. Please bring: photo ID, proof of insurance and income, social security card, and any discharge paperwork. Contact information: [address] [place] [address] [phone]           Follow-up recommendations: Activity as tolerated. Diet as recommended by primary care physician. Keep all scheduled follow-up appointments as recommended.   Comments:   Patient is instructed to take all prescribed medications as recommended. Report any side effects or adverse reactions to your outpatient psychiatrist. Patient is instructed to abstain from alcohol and illegal drugs while on prescription medications. In the event of worsening symptoms, patient is instructed to call the crisis hotline, 911, or go to the nearest emergency department for evaluation and treatment.  Signed: Aldean BakerJanet E Sykes, NP 10/29/2018, 9:37 AM   Patient seen, Suicide Assessment Completed.  Disposition Plan Reviewed

## 2019-02-12 IMAGING — CT CT HAND*R* W/O CM
1 series · 12 of 14 positions shown, 15 images · non-contrast
Comparison: None.

CLINICAL DATA: Pt slammed 4th and 5th fingers in a door 5 days ago.

EXAM:
CT OF THE RIGHT HAND WITHOUT CONTRAST
TECHNIQUE: Multidetector CT imaging of the right hand was performed according
to the standard protocol. Multiplanar CT image reconstructions were
also generated.

[Series 3: ext-soft · axial · 0.28mm/px · z∈[+852,+1050]mm · 12 of 118 slices shown, 15 images]
[im 10/118  soft-tissue]
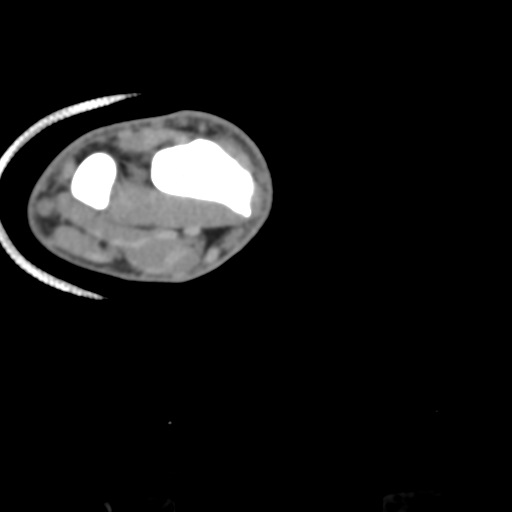
[im 10/118  bone]
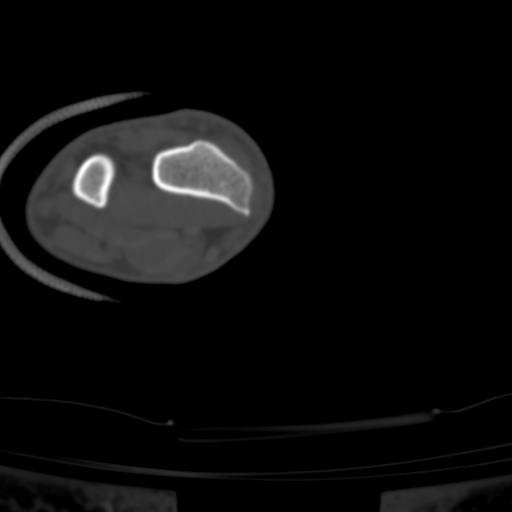
[im 19/118  bone]
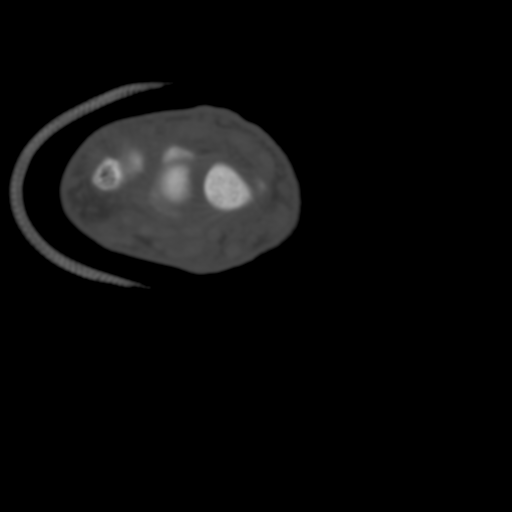
[im 28/118  bone]
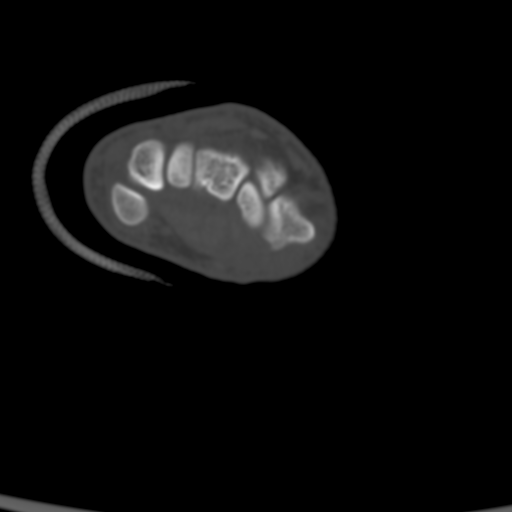
[im 37/118  bone]
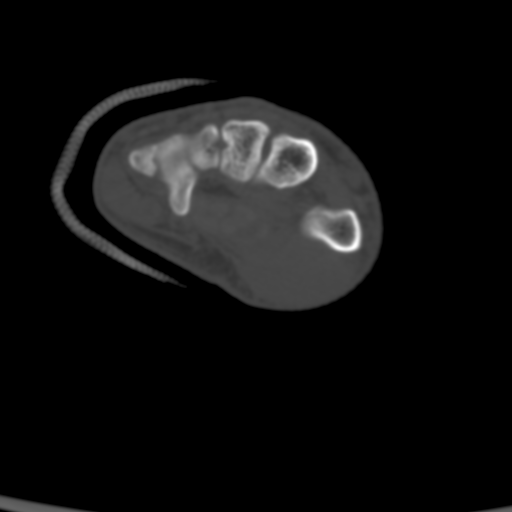
[im 46/118  soft-tissue]
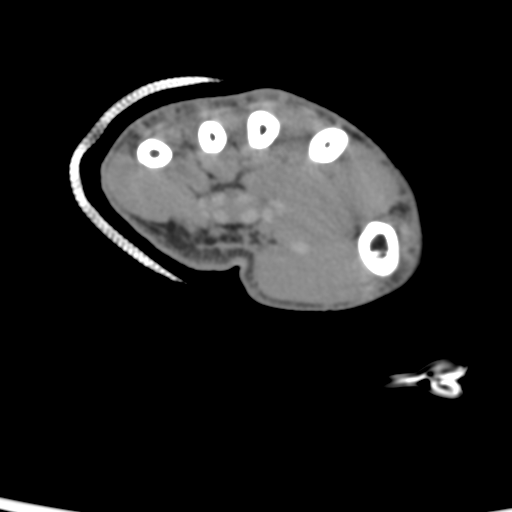
[im 46/118  bone]
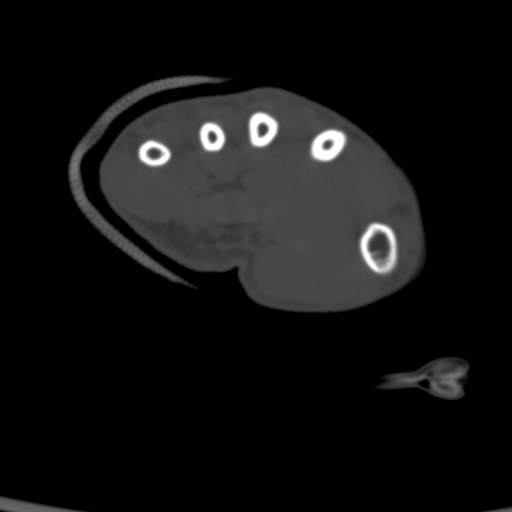
[im 55/118  bone]
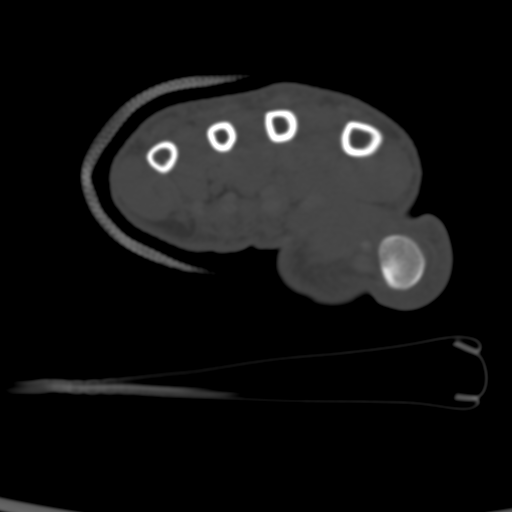
[im 64/118  bone]
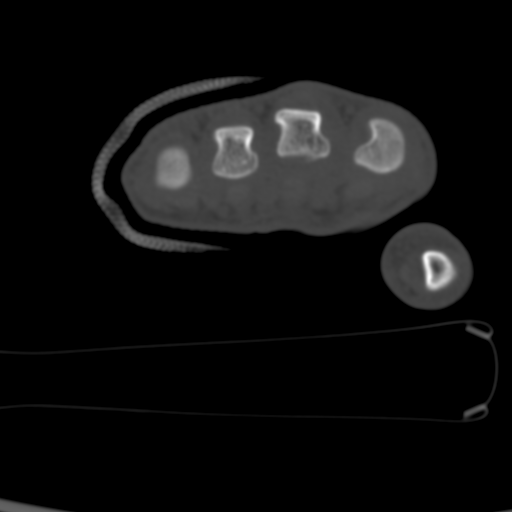
[im 73/118  bone]
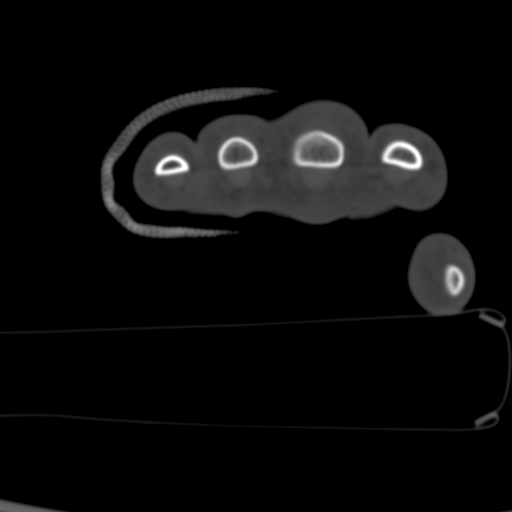
[im 82/118  soft-tissue]
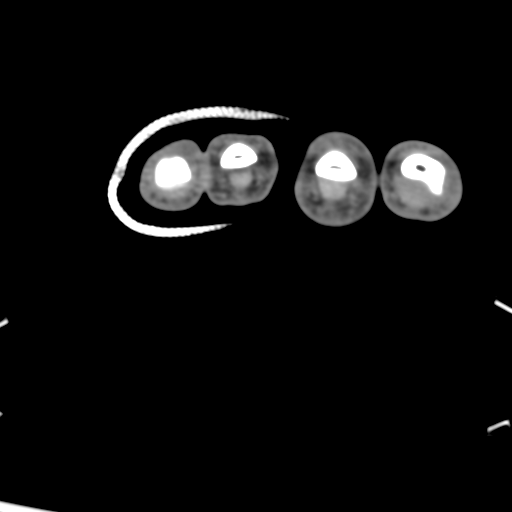
[im 82/118  bone]
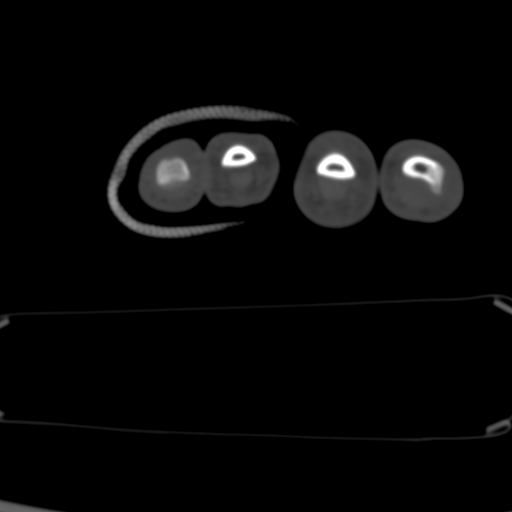
[im 91/118  bone]
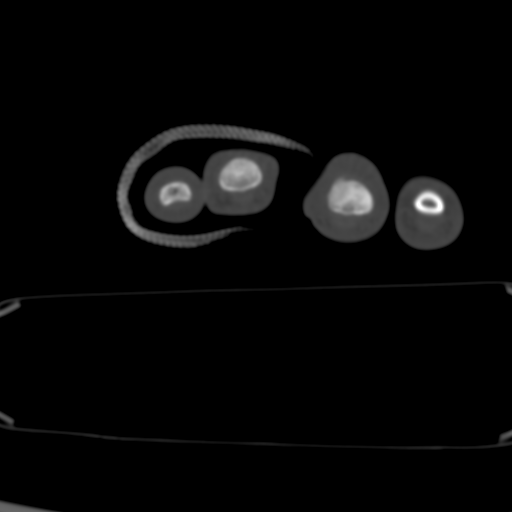
[im 100/118  bone]
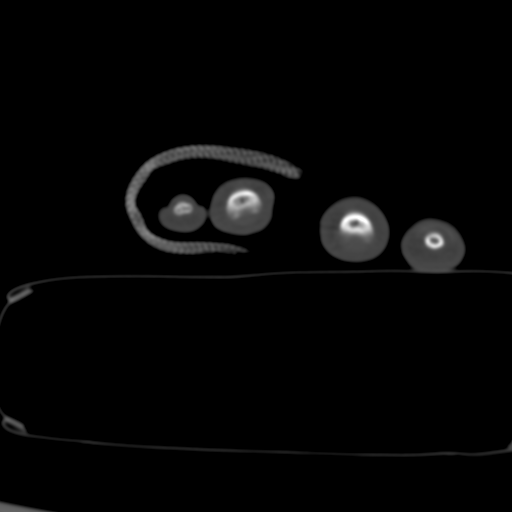
[im 109/118  bone]
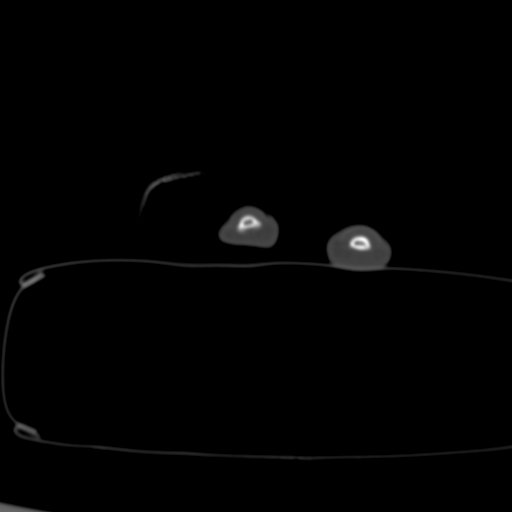

[12 of 14 positions shown; findings below may reference images not displayed]

FINDINGS: Bones/Joint/Cartilage

Comminuted fracture at the base of the fifth metacarpal involving
the articular surface with 3 mm of distraction between the major
fracture fragments and 3 mm of distal displacement of the major
fracture fragment. No significant angulation.

No other acute fracture or dislocation. Otherwise normal alignment.
No joint effusion.

Ligaments

Ligaments are suboptimally evaluated by CT.

Muscles and Tendons
Muscles are normal. Flexor and extensor compartment tendons are
grossly intact.

Soft tissue
No fluid collection or hematoma.  No soft tissue mass.
IMPRESSION: 1. Comminuted fracture at the base of the fifth metacarpal involving
the articular surface with 3 mm of distraction between the major
fracture fragments and 3 mm of distal displacement of the major
fracture fragment.
